# Patient Record
Sex: Female | Born: 1935 | Race: White | Hispanic: No | Smoking: Never smoker
Health system: Southern US, Community
[De-identification: ages and names within clinical notes are randomized; demographics above are authoritative.]

## PROBLEM LIST (undated history)

## (undated) DIAGNOSIS — Z950 Presence of cardiac pacemaker: Secondary | ICD-10-CM

## (undated) DIAGNOSIS — Z8673 Personal history of transient ischemic attack (TIA), and cerebral infarction without residual deficits: Secondary | ICD-10-CM

## (undated) DIAGNOSIS — I119 Hypertensive heart disease without heart failure: Secondary | ICD-10-CM

## (undated) DIAGNOSIS — I4891 Unspecified atrial fibrillation: Principal | ICD-10-CM

## (undated) DIAGNOSIS — E785 Hyperlipidemia, unspecified: Secondary | ICD-10-CM

## (undated) DIAGNOSIS — I10 Essential (primary) hypertension: Secondary | ICD-10-CM

## (undated) DIAGNOSIS — I2510 Atherosclerotic heart disease of native coronary artery without angina pectoris: Secondary | ICD-10-CM

## (undated) DIAGNOSIS — H409 Unspecified glaucoma: Secondary | ICD-10-CM

## (undated) HISTORY — PX: ABDOMINAL HYSTERECTOMY: SHX81

## (undated) HISTORY — PX: CARPAL TUNNEL RELEASE: SHX101

## (undated) HISTORY — PX: CHOLECYSTECTOMY: SHX55

## (undated) HISTORY — PX: PACEMAKER INSERTION: SHX728

## (undated) HISTORY — PX: CAROTID STENT: SHX1301

## (undated) HISTORY — PX: EYE SURGERY: SHX253

---

## 2013-04-20 ENCOUNTER — Emergency Department (HOSPITAL_BASED_OUTPATIENT_CLINIC_OR_DEPARTMENT_OTHER): Payer: Medicare Other

## 2013-04-20 ENCOUNTER — Inpatient Hospital Stay (HOSPITAL_BASED_OUTPATIENT_CLINIC_OR_DEPARTMENT_OTHER)
Admission: EM | Admit: 2013-04-20 | Discharge: 2013-04-25 | DRG: 308 | Disposition: A | Discharge status: Home / Self Care | Payer: Medicare Other | Attending: Cardiology | Admitting: Cardiology

## 2013-04-20 ENCOUNTER — Encounter (HOSPITAL_BASED_OUTPATIENT_CLINIC_OR_DEPARTMENT_OTHER): Payer: Self-pay | Admitting: *Deleted

## 2013-04-20 DIAGNOSIS — R079 Chest pain, unspecified: Secondary | ICD-10-CM

## 2013-04-20 DIAGNOSIS — Z7901 Long term (current) use of anticoagulants: Secondary | ICD-10-CM

## 2013-04-20 DIAGNOSIS — I4891 Unspecified atrial fibrillation: Principal | ICD-10-CM

## 2013-04-20 DIAGNOSIS — I10 Essential (primary) hypertension: Secondary | ICD-10-CM | POA: Diagnosis present

## 2013-04-20 DIAGNOSIS — I110 Hypertensive heart disease with heart failure: Secondary | ICD-10-CM | POA: Diagnosis present

## 2013-04-20 DIAGNOSIS — K279 Peptic ulcer, site unspecified, unspecified as acute or chronic, without hemorrhage or perforation: Secondary | ICD-10-CM | POA: Diagnosis present

## 2013-04-20 DIAGNOSIS — Z8673 Personal history of transient ischemic attack (TIA), and cerebral infarction without residual deficits: Secondary | ICD-10-CM

## 2013-04-20 DIAGNOSIS — R269 Unspecified abnormalities of gait and mobility: Secondary | ICD-10-CM | POA: Diagnosis present

## 2013-04-20 DIAGNOSIS — H409 Unspecified glaucoma: Secondary | ICD-10-CM | POA: Diagnosis present

## 2013-04-20 DIAGNOSIS — E669 Obesity, unspecified: Secondary | ICD-10-CM | POA: Diagnosis present

## 2013-04-20 DIAGNOSIS — I5031 Acute diastolic (congestive) heart failure: Secondary | ICD-10-CM | POA: Diagnosis present

## 2013-04-20 DIAGNOSIS — Z6833 Body mass index (BMI) 33.0-33.9, adult: Secondary | ICD-10-CM

## 2013-04-20 DIAGNOSIS — Z7902 Long term (current) use of antithrombotics/antiplatelets: Secondary | ICD-10-CM

## 2013-04-20 DIAGNOSIS — Z950 Presence of cardiac pacemaker: Secondary | ICD-10-CM

## 2013-04-20 DIAGNOSIS — I2510 Atherosclerotic heart disease of native coronary artery without angina pectoris: Secondary | ICD-10-CM

## 2013-04-20 DIAGNOSIS — G909 Disorder of the autonomic nervous system, unspecified: Secondary | ICD-10-CM

## 2013-04-20 DIAGNOSIS — I6990 Unspecified sequelae of unspecified cerebrovascular disease: Secondary | ICD-10-CM

## 2013-04-20 DIAGNOSIS — I495 Sick sinus syndrome: Secondary | ICD-10-CM | POA: Diagnosis present

## 2013-04-20 DIAGNOSIS — Z9861 Coronary angioplasty status: Secondary | ICD-10-CM

## 2013-04-20 DIAGNOSIS — E039 Hypothyroidism, unspecified: Secondary | ICD-10-CM | POA: Diagnosis present

## 2013-04-20 DIAGNOSIS — I119 Hypertensive heart disease without heart failure: Secondary | ICD-10-CM | POA: Diagnosis present

## 2013-04-20 DIAGNOSIS — K219 Gastro-esophageal reflux disease without esophagitis: Secondary | ICD-10-CM | POA: Diagnosis present

## 2013-04-20 DIAGNOSIS — Z91041 Radiographic dye allergy status: Secondary | ICD-10-CM

## 2013-04-20 DIAGNOSIS — E785 Hyperlipidemia, unspecified: Secondary | ICD-10-CM | POA: Diagnosis present

## 2013-04-20 DIAGNOSIS — R0789 Other chest pain: Secondary | ICD-10-CM | POA: Diagnosis present

## 2013-04-20 DIAGNOSIS — I509 Heart failure, unspecified: Secondary | ICD-10-CM | POA: Diagnosis present

## 2013-04-20 HISTORY — DX: Unspecified atrial fibrillation: I48.91

## 2013-04-20 HISTORY — DX: Personal history of transient ischemic attack (TIA), and cerebral infarction without residual deficits: Z86.73

## 2013-04-20 HISTORY — DX: Presence of cardiac pacemaker: Z95.0

## 2013-04-20 HISTORY — DX: Hyperlipidemia, unspecified: E78.5

## 2013-04-20 HISTORY — DX: Unspecified glaucoma: H40.9

## 2013-04-20 HISTORY — DX: Essential (primary) hypertension: I10

## 2013-04-20 HISTORY — DX: Atherosclerotic heart disease of native coronary artery without angina pectoris: I25.10

## 2013-04-20 HISTORY — DX: Hypertensive heart disease without heart failure: I11.9

## 2013-04-20 LAB — CBC WITH DIFFERENTIAL/PLATELET
Basophils Absolute: 0 10*3/uL (ref 0.0–0.1)
Basophils Relative: 0 % (ref 0–1)
Basophils Relative: 0 % (ref 0–1)
Eosinophils Absolute: 0.1 10*3/uL (ref 0.0–0.7)
Eosinophils Absolute: 0.1 10*3/uL (ref 0.0–0.7)
Eosinophils Relative: 1 % (ref 0–5)
Eosinophils Relative: 1 % (ref 0–5)
HCT: 43.6 % (ref 36.0–46.0)
HCT: 41.4 % (ref 36.0–46.0)
Hemoglobin: 14.5 g/dL (ref 12.0–15.0)
Hemoglobin: 14.1 g/dL (ref 12.0–15.0)
Lymphocytes Relative: 28 % (ref 12–46)
Lymphs Abs: 2.3 10*3/uL (ref 0.7–4.0)
MCH: 30.8 pg (ref 26.0–34.0)
MCH: 30.9 pg (ref 26.0–34.0)
MCHC: 33.3 g/dL (ref 30.0–36.0)
MCHC: 34.1 g/dL (ref 30.0–36.0)
MCV: 90.6 fL (ref 78.0–100.0)
Monocytes Absolute: 0.6 10*3/uL (ref 0.1–1.0)
Monocytes Absolute: 0.7 10*3/uL (ref 0.1–1.0)
Monocytes Relative: 9 % (ref 3–12)
Monocytes Relative: 8 % (ref 3–12)
Neutro Abs: 5.2 10*3/uL (ref 1.7–7.7)
Neutrophils Relative %: 63 % (ref 43–77)
Platelets: 201 10*3/uL (ref 150–400)
RBC: 4.57 MIL/uL (ref 3.87–5.11)
RDW: 13.5 % (ref 11.5–15.5)
RDW: 13.4 % (ref 11.5–15.5)
WBC: 8.2 10*3/uL (ref 4.0–10.5)

## 2013-04-20 LAB — TROPONIN I
Troponin I: <0.3 ng/mL (ref <0.30)
Troponin I: <0.3 ng/mL

## 2013-04-20 LAB — COMPREHENSIVE METABOLIC PANEL
Albumin: 3.5 g/dL (ref 3.5–5.2)
BUN: 11 mg/dL (ref 6–23)
Calcium: 9.4 mg/dL (ref 8.4–10.5)
Creatinine, Ser: 0.8 mg/dL (ref 0.50–1.10)
Total Bilirubin: 0.4 mg/dL (ref 0.3–1.2)
Total Protein: 7.2 g/dL (ref 6.0–8.3)

## 2013-04-20 LAB — PROTIME-INR
INR: 0.93 (ref 0.00–1.49)
Prothrombin Time: 12.4 s (ref 11.6–15.2)

## 2013-04-20 LAB — APTT: aPTT: 32 s (ref 24–37)

## 2013-04-20 LAB — MRSA PCR SCREENING: MRSA by PCR: NEGATIVE

## 2013-04-20 MED ORDER — SODIUM CHLORIDE 0.9 % IV SOLN
250.0000 mL | INTRAVENOUS | Status: DC | PRN
  Administered 2013-04-22: 250 mL via INTRAVENOUS

## 2013-04-20 MED ORDER — SODIUM CHLORIDE 0.9 % IJ SOLN: 3.0000 mL | INTRAMUSCULAR | Status: DC | PRN

## 2013-04-20 MED ORDER — METOPROLOL TARTRATE 1 MG/ML IV SOLN
2.5000 mg | Freq: Once | INTRAVENOUS | Status: AC
  Administered 2013-04-20: 2.5 mg via INTRAVENOUS
  Filled 2013-04-20: qty 5

## 2013-04-20 MED ORDER — ONDANSETRON HCL 4 MG/2ML IJ SOLN: 4.0000 mg | Freq: Four times a day (QID) | INTRAMUSCULAR | Status: DC | PRN

## 2013-04-20 MED ORDER — LATANOPROST 0.005 % OP SOLN
1.0000 [drp] | Freq: Every day | OPHTHALMIC | Status: DC
  Administered 2013-04-20 – 2013-04-24 (×5): 1 [drp] via OPHTHALMIC
  Filled 2013-04-20 (×2): qty 2.5

## 2013-04-20 MED ORDER — METOPROLOL TARTRATE 25 MG PO TABS
25.0000 mg | ORAL_TABLET | Freq: Three times a day (TID) | ORAL | Status: DC
  Administered 2013-04-20: 25 mg via ORAL
  Filled 2013-04-20 (×5): qty 1

## 2013-04-20 MED ORDER — CLOPIDOGREL BISULFATE 75 MG PO TABS
75.0000 mg | ORAL_TABLET | Freq: Every day | ORAL | Status: DC
  Administered 2013-04-21 – 2013-04-23 (×3): 75 mg via ORAL
  Filled 2013-04-20 (×3): qty 1

## 2013-04-20 MED ORDER — SIMVASTATIN 40 MG PO TABS
40.0000 mg | ORAL_TABLET | Freq: Every day | ORAL | Status: DC
  Administered 2013-04-21 – 2013-04-22 (×2): 40 mg via ORAL
  Filled 2013-04-20 (×5): qty 1

## 2013-04-20 MED ORDER — LEVOTHYROXINE SODIUM 100 MCG PO TABS
100.0000 ug | ORAL_TABLET | Freq: Every day | ORAL | Status: DC
  Administered 2013-04-21 – 2013-04-25 (×5): 100 ug via ORAL
  Filled 2013-04-20 (×8): qty 1

## 2013-04-20 MED ORDER — ASPIRIN EC 81 MG PO TBEC
81.0000 mg | DELAYED_RELEASE_TABLET | Freq: Every day | ORAL | Status: DC
  Administered 2013-04-20: 81 mg via ORAL
  Filled 2013-04-20 (×2): qty 1

## 2013-04-20 MED ORDER — SODIUM CHLORIDE 0.9 % IJ SOLN
3.0000 mL | Freq: Two times a day (BID) | INTRAMUSCULAR | Status: DC
  Administered 2013-04-20 – 2013-04-22 (×3): 3 mL via INTRAVENOUS
  Administered 2013-04-22: 11:00:00 via INTRAVENOUS
  Administered 2013-04-23 – 2013-04-24 (×2): 3 mL via INTRAVENOUS

## 2013-04-20 MED ORDER — ACETAMINOPHEN 325 MG PO TABS
650.0000 mg | ORAL_TABLET | ORAL | Status: DC | PRN
  Administered 2013-04-20 – 2013-04-23 (×2): 650 mg via ORAL
  Filled 2013-04-20 (×2): qty 2

## 2013-04-20 MED ORDER — POTASSIUM CHLORIDE CRYS ER 20 MEQ PO TBCR
20.0000 meq | EXTENDED_RELEASE_TABLET | Freq: Once | ORAL | Status: AC
  Administered 2013-04-20: 20 meq via ORAL
  Filled 2013-04-20: qty 1

## 2013-04-20 MED ORDER — HYDRALAZINE HCL 25 MG PO TABS
25.0000 mg | ORAL_TABLET | Freq: Three times a day (TID) | ORAL | Status: DC
  Administered 2013-04-20 – 2013-04-25 (×14): 25 mg via ORAL
  Filled 2013-04-20 (×20): qty 1

## 2013-04-20 NOTE — ED Notes (Signed)
Report given to Marylene Land, RN [facility] [id number].

## 2013-04-20 NOTE — ED Notes (Signed)
MD at bedside. 

## 2013-04-20 NOTE — ED Notes (Signed)
[facility] at bedside preparing patient for transport.  

## 2013-04-20 NOTE — H&P (Addendum)
[name] is an 79 y.o. female.   Chief Complaint: palpitations/atrial fibrillation HPI: 79 yo woman with PMH of CAD, stent in 2009 on plavix, CVA 8 years ago, hypertension - volatile blood pressure with presumed autonomic dysfunction, stomach ulcers, symptomatic bradycardia with PPM 4 years ago here after recent move from her home in [place] to [place] with her son with atrial fibrillation + RVR. She tells me she was first diagnosed with atrial fibrillation in December 2013 with trial of warfarin but given history of aneursym it was continued continued and she is very averse to blood thinners. She also has been intolerant of aspirin given worsening ulcers/reflux and she has been on plavix without issues since before her stenting for prior CVA. She has volatile blood pressure and takes hydralazine 25 mg 1-3x daily based on need with bp ranging from 110s-200s/120s. She was last in atrial fibrillation a week ago and she converted with one dose of metoprolol 25 mg, which she takes 3-4x daily approximately every 6-7 hours. She did have some brief chest pain, substernal to left sided slight pressure when her HR was higher that is not currently present. She feels well and has only received IV metoprolol 2.5 mg x1 with HR now 90s-100s. She feels like her atrial fibrillation is kicking in because she's stressed because she was just forced to sell her home in [place] and move to [place] on Thursday to live with her son. No fever/chills/sick contacts. Sleeps on 2 pillows. No PND. Left-sided residual weakness requires her to walk with walker/cane but she gets around and this is stable.   Past Medical History  Diagnosis Date  . Coronary artery disease   . A-fib   . Stroke   . Hypertension   . Glaucoma     Past Surgical History  Procedure Laterality Date  . Pacemaker insertion    . Cholecystectomy    . Abdominal hysterectomy    . Carpal tunnel release    . Eye surgery    . Carotid stent       History reviewed. No pertinent family history. Social History:  reports that she has never smoked. She does not have any smokeless tobacco history on file. She reports that she does not drink alcohol or use illicit drugs.  Allergies:  Allergies  Allergen Reactions  . Cardizem (Diltiazem Hcl) Rash  . Ivp Dye (Iodinated Diagnostic Agents) Rash    Medications Prior to Admission  Medication Sig Dispense Refill  . clopidogrel (PLAVIX) 75 MG tablet Take 75 mg by mouth daily.      . hydrALAZINE (APRESOLINE) 25 MG tablet Take 25 mg by mouth daily as needed (for high blood pressure).       Marland Kitchen latanoprost (XALATAN) 0.005 % ophthalmic solution Place 1 drop into both eyes at bedtime.      Marland Kitchen levothyroxine (SYNTHROID, LEVOTHROID) 100 MCG tablet Take 100 mcg by mouth daily.      . metoprolol tartrate (LOPRESSOR) 25 MG tablet Take 25 mg by mouth 3 (three) times daily.      . pravastatin (PRAVACHOL) 40 MG tablet Take 80 mg by mouth daily.        Results for orders placed during the hospital encounter of 04/20/13 (from the past 48 hour(s))  CBC WITH DIFFERENTIAL     Status: None   Collection Time    04/20/13  2:46 PM      Result Value Range   WBC 7.5  4.0 - 10.5 K/uL  RBC 4.71  3.87 - 5.11 MIL/uL   Hemoglobin 14.5  12.0 - 15.0 g/dL   HCT 16.1  09.6 - 04.5 %   MCV 92.6  78.0 - 100.0 fL   MCH 30.8  26.0 - 34.0 pg   MCHC 33.3  30.0 - 36.0 g/dL   RDW 40.9  81.1 - 91.4 %   Platelets 196  150 - 400 K/uL   Neutrophils Relative % 63  43 - 77 %   Neutro Abs 4.7  1.7 - 7.7 K/uL   Lymphocytes Relative 28  12 - 46 %   Lymphs Abs 2.1  0.7 - 4.0 K/uL   Monocytes Relative 9  3 - 12 %   Monocytes Absolute 0.6  0.1 - 1.0 K/uL   Eosinophils Relative 1  0 - 5 %   Eosinophils Absolute 0.1  0.0 - 0.7 K/uL   Basophils Relative 0  0 - 1 %   Basophils Absolute 0.0  0.0 - 0.1 K/uL  COMPREHENSIVE METABOLIC PANEL     Status: Abnormal   Collection Time    04/20/13  2:46 PM      Result Value Range   Sodium  140  135 - 145 mEq/L   Potassium 3.8  3.5 - 5.1 mEq/L   Chloride 102  96 - 112 mEq/L   CO2 28  19 - 32 mEq/L   Glucose, Bld 137 (*) 70 - 99 mg/dL   BUN 11  6 - 23 mg/dL   Creatinine, Ser 7.82  0.50 - 1.10 mg/dL   Calcium 9.4  8.4 - 95.6 mg/dL   Total Protein 7.2  6.0 - 8.3 g/dL   Albumin 3.5  3.5 - 5.2 g/dL   AST 16  0 - 37 U/L   ALT 13  0 - 35 U/L   Alkaline Phosphatase 88  39 - 117 U/L   Total Bilirubin 0.4  0.3 - 1.2 mg/dL   GFR calc non Af Amer 69 (*) >90 mL/min   GFR calc Af Amer 80 (*) >90 mL/min   Comment:            The eGFR has been calculated     using the CKD EPI equation.     This calculation has not been     validated in all clinical     situations.     eGFR's persistently     <90 mL/min signify     possible Chronic Kidney Disease.  TROPONIN I     Status: None   Collection Time    04/20/13  2:46 PM      Result Value Range   Troponin I <0.30  <0.30 ng/mL   Comment:            Due to the release kinetics of cTnI,     a negative result within the first hours     of the onset of symptoms does not rule out     myocardial infarction with certainty.     If myocardial infarction is still suspected,     repeat the test at appropriate intervals.   Dg Chest Port 1 View  04/20/2013   *RADIOLOGY REPORT*  Clinical Data: Chest pain, atrial fibrillation  PORTABLE CHEST - 1 VIEW  Comparison: None.  Findings: Cardiomegaly is noted.  No acute infiltrate or pulmonary edema.  Dual lead cardiac pacemaker in place.  IMPRESSION: No active disease.  Cardiomegaly.  Dual lead cardiac pacemaker in place.   Original Report Authenticated  By: Natasha Mead, M.D.    Review of Systems  Constitutional: Negative for fever, chills and weight loss.  HENT: Negative for hearing loss, ear pain, nosebleeds and neck pain.   Eyes: Negative for photophobia, pain and discharge.  Respiratory: Negative for hemoptysis, sputum production and shortness of breath.   Cardiovascular: Positive for chest pain and  palpitations. Negative for orthopnea and leg swelling.  Gastrointestinal: Positive for nausea and diarrhea. Negative for heartburn, vomiting, abdominal pain, constipation, blood in stool and melena.  Genitourinary: Negative for frequency and hematuria.  Musculoskeletal: Negative for myalgias.  Skin: Negative for itching and rash.  Neurological: Positive for headaches. Negative for tingling, tremors and sensory change.  Endo/Heme/Allergies: Negative for polydipsia. Bruises/bleeds easily.  Psychiatric/Behavioral: Negative for depression and suicidal ideas.    Blood pressure 161/82, pulse 96, temperature 98 F (36.7 C), temperature source Oral, resp. rate 30, SpO2 97.00%. Physical Exam  Nursing note and vitals reviewed. Constitutional: She is oriented to person, place, and time. She appears well-developed and well-nourished. No distress.  HENT:  Head: Normocephalic and atraumatic.  Nose: Nose normal.  Mouth/Throat: Oropharynx is clear and moist. No oropharyngeal exudate.  Eyes: Conjunctivae and EOM are normal. Pupils are equal, round, and reactive to light. No scleral icterus.  Neck: Normal range of motion. Neck supple. JVD present. No tracheal deviation present. No thyromegaly present.  3 cm above clavicle at 30 degrees  Cardiovascular: Intact distal pulses.  Exam reveals no gallop.   No murmur heard. Irregularly irregular HR 100s  Respiratory: Effort normal and breath sounds normal. No respiratory distress. She has no wheezes. She has no rales.  GI: Soft. Bowel sounds are normal. She exhibits no distension. There is no tenderness. There is no rebound.  Musculoskeletal: Normal range of motion. She exhibits no edema and no tenderness.  Right > left strength; left 4/5, right 5/5 - baseline per patient with prior CVA  Neurological: She is alert and oriented to person, place, and time. No cranial nerve deficit.  Skin: Skin is warm and dry. No rash noted. She is not diaphoretic. No erythema.   Psychiatric: She has a normal mood and affect. Her behavior is normal. Judgment and thought content normal.    Labs reviewed; wbc 7.5, h/h 14.5/43.6, plt 146, na 140, K 3.8, bun/cr 4/0.8, ast/alt 16/13, troponin < 0.3 Chest x-ray: no acute process, dual--chamber PM ECG: atrial fibrillation + RVR rate 130s, subtle inferior twi  Problem List Atrial fibrillation + RVR Coronary artery disease with prior stent in 2009 on Plavix Prior CVA 8 years ago History of aneurysm but another physician has told her she didn't have one Hypertension, autonomic dysfunction Peptic Ulcer Disease Symptomatic Bradycardia s/p PPM 4 years ago, [employer] Chest Pain associated with elevated HR hypothyroidism  Assessment/Plan 79 yo woman with CAD, prior stent, CVA, questionable aneurysm, intolerant of aspirin, volatile blood pressure with symptoms/signs c/w autonomic dysfunction, PUD, symptomatic bradycardia with PPM here with atrial fibrillation with RVR. Apparent trigger for atrial fibrillation is her recent move/selling home. Potential other triggers include UTI (has had before), other infections, development of heart failure, MI, worsening/development of structural heart disease, thyroid disease among other etiologies. She currently is rate controlled on metoprolol. She's averse to anticoagulation given her PUD, issues (questionable) with warfarin and possible aneurysm. Her C2HA2DVASC is 98 for age, female, prior CVA, age > 79, hypertension. We had a long discussion and she understands the risks of stroke and we will need to obtain outside records (Neurologist she saw was  in [place], [place]), potentially discuss with neurology. Apixaban may be the best agent for anticoagulation in her low bleeding risk profile. She is willing to take aspirin. At this time, NPO after MN in case we need to pursue DCCV, aspirin 81 mg now, continue plavix, trend troponins, continue telemetry, 25 mg q6h metoprolol PO, gentle blood pressure  control with hydralazine and evaluate for triggers. I discussed the plan in depth with Ms. Latorre and her son and her risk of stroke in setting of atrial fibrillation despite limited anticoagulation (just aspirin and plavix) which she prefers at this time. We will need to obtain records from [place], [place], [place], [place] among other locations.  - aspirin 81 mg now and daily in hospital - continue plavix 75 mg daily - hydralazine 25 mg PO q8h blood pressure with holding paramaters - continue synthroid - tsh, bnp, lipid panel, urinalysis  - echocardiogram in AM - rate control with metoprolol 25 mg PO q6h - consider apixaban instead of aspirin based on further discussions with patient, outside hospital review and finances Katrianna Friesenhahn 04/20/2013, 6:29 PM

## 2013-04-20 NOTE — ED Notes (Signed)
Pt brought to ED via POV  C/o CP and weakness. Taken to ED5, EKG done. Placed on monitor showing Afib. States she just moved here from [place] and has "gotten all worked up". Dr. Judd Lien in to eval.

## 2013-04-20 NOTE — ED Notes (Signed)
Report given to [place], RN

## 2013-04-20 NOTE — ED Provider Notes (Signed)
History     CSN: [id number]  Arrival date & time 04/20/13  1420   First MD Initiated Contact with Patient 04/20/13 1428      Chief Complaint  Patient presents with  . Chest Pain    (Consider location/radiation/quality/duration/timing/severity/associated sxs/prior treatment) HPI Comments: Patient with history of CAD with stent, Afib, Pacer, CVA.  Presents with complaints of palpitations, believes she is in afib again.  She does not report chest pain at present but reports having some intermittent discomfort for the past few days.  She recently moved here from [place] and has no cardiologist locally.  She recently moved in with her son and reports she felt stressed today prior to the afib starting.  She denies shortness of breath.  No fevers or chills.  She did take an extra dose of her metoprolol prior to coming in but this did not help.  Patient is a 79 y.o. female presenting with palpitations. The history is provided by the patient.  Palpitations Palpitations quality:  Irregular Onset quality:  Sudden Timing:  Constant Progression:  Unchanged Chronicity:  Recurrent Context comment:  Stress Relieved by:  Nothing Worsened by:  Nothing tried Ineffective treatments:  Beta blockers Associated symptoms: no leg pain, no lower extremity edema and no shortness of breath     Past Medical History  Diagnosis Date  . Coronary artery disease   . A-fib   . Stroke   . Hypertension   . Glaucoma     Past Surgical History  Procedure Laterality Date  . Pacemaker insertion    . Cholecystectomy    . Abdominal hysterectomy    . Carpal tunnel release    . Eye surgery    . Carotid stent      History reviewed. No pertinent family history.  History  Substance Use Topics  . Smoking status: Never Smoker   . Smokeless tobacco: Not on file  . Alcohol Use: No    OB History   Grav Para Term Preterm Abortions TAB SAB Ect Mult Living                  Review of Systems  Respiratory:  Negative for shortness of breath.   Cardiovascular: Positive for palpitations.  All other systems reviewed and are negative.    Allergies  Cardizem and Ivp dye  Home Medications  No current outpatient prescriptions on file.  BP 186/127  Pulse 92  Temp(Src) 97.8 F (36.6 C) (Oral)  Resp 20  SpO2 97%  Physical Exam  Nursing note and vitals reviewed. Constitutional: She is oriented to person, place, and time. She appears well-developed and well-nourished. No distress.  HENT:  Head: Normocephalic and atraumatic.  Neck: Normal range of motion. Neck supple.  Cardiovascular:  No murmur heard. Heart rate is irregularly irregular.  There are no murmurs.    Pulmonary/Chest: Effort normal and breath sounds normal. No respiratory distress. She has no wheezes.  Abdominal: Soft. Bowel sounds are normal. She exhibits no distension. There is no tenderness.  Musculoskeletal: Normal range of motion.  Neurological: She is alert and oriented to person, place, and time.  Skin: Skin is warm and dry. She is not diaphoretic.    ED Course  Procedures (including critical care time)  Labs Reviewed - No data to display No results found.   No diagnosis found.   Date: 04/20/2013  Rate: 130  Rhythm: atrial fibrillation  QRS Axis: normal  Intervals: normal  ST/T Wave abnormalities: non-specific T wave abnormalities  Conduction Disutrbances:none  Narrative Interpretation:   Old EKG Reviewed: none available    MDM  The patient presents with atrial fibrillation and chest discomfort.  Workup this afternoon shows a negative troponin and ekg without st changes.  She was given lopressor and the rate has improved, however she remains in afib.  I have spoken with Dr. Donnie Aho and she will be admitted to cardiology.          Geoffery Lyons, MD 04/20/13 1536

## 2013-04-21 DIAGNOSIS — Z8673 Personal history of transient ischemic attack (TIA), and cerebral infarction without residual deficits: Secondary | ICD-10-CM

## 2013-04-21 DIAGNOSIS — Z950 Presence of cardiac pacemaker: Secondary | ICD-10-CM | POA: Diagnosis present

## 2013-04-21 HISTORY — DX: Personal history of transient ischemic attack (TIA), and cerebral infarction without residual deficits: Z86.73

## 2013-04-21 HISTORY — DX: Presence of cardiac pacemaker: Z95.0

## 2013-04-21 LAB — CBC
HCT: 41.1 % (ref 36.0–46.0)
MCV: 90.7 fL (ref 78.0–100.0)
RBC: 4.53 MIL/uL (ref 3.87–5.11)
WBC: 7.5 10*3/uL (ref 4.0–10.5)

## 2013-04-21 LAB — LIPID PANEL
Cholesterol: 180 mg/dL (ref 0–200)
HDL: 66 mg/dL (ref >39)
Total CHOL/HDL Ratio: 2.7 RATIO
VLDL: 30 mg/dL (ref 0–40)

## 2013-04-21 LAB — BASIC METABOLIC PANEL
BUN: 10 mg/dL (ref 6–23)
CO2: 29 mEq/L (ref 19–32)
Chloride: 104 mEq/L (ref 96–112)
Creatinine, Ser: 0.75 mg/dL (ref 0.50–1.10)
GFR calc Af Amer: >90 mL/min (ref >90)
Potassium: 3.6 mEq/L (ref 3.5–5.1)

## 2013-04-21 LAB — HEPARIN LEVEL (UNFRACTIONATED): Heparin Unfractionated: 0.25 IU/mL — ABNORMAL LOW (ref 0.30–0.70)

## 2013-04-21 LAB — HEMOGLOBIN A1C: Mean Plasma Glucose: 111 mg/dL (ref <117)

## 2013-04-21 MED ORDER — PREDNISONE 50 MG PO TABS: 60.0000 mg | ORAL_TABLET | ORAL | Status: DC

## 2013-04-21 MED ORDER — DIPHENHYDRAMINE HCL 50 MG/ML IJ SOLN: 25.0000 mg | INTRAMUSCULAR | Status: DC

## 2013-04-21 MED ORDER — FAMOTIDINE IN NACL 20-0.9 MG/50ML-% IV SOLN: 20.0000 mg | INTRAVENOUS | Status: DC

## 2013-04-21 MED ORDER — FAMOTIDINE IN NACL 20-0.9 MG/50ML-% IV SOLN
20.0000 mg | Freq: Once | INTRAVENOUS | Status: AC
  Administered 2013-04-22: 20 mg via INTRAVENOUS
  Filled 2013-04-21: qty 50

## 2013-04-21 MED ORDER — HEPARIN (PORCINE) IN NACL 100-0.45 UNIT/ML-% IJ SOLN
1050.0000 [IU]/h | INTRAMUSCULAR | Status: DC
  Administered 2013-04-22: 1100 [IU]/h via INTRAVENOUS
  Filled 2013-04-21 (×2): qty 250

## 2013-04-21 MED ORDER — DIPHENHYDRAMINE HCL 50 MG/ML IJ SOLN
25.0000 mg | Freq: Once | INTRAMUSCULAR | Status: AC
  Administered 2013-04-22: 25 mg via INTRAVENOUS
  Filled 2013-04-21: qty 1

## 2013-04-21 MED ORDER — HEPARIN BOLUS VIA INFUSION
4000.0000 [IU] | Freq: Once | INTRAVENOUS | Status: AC
  Administered 2013-04-21: 4000 [IU] via INTRAVENOUS
  Filled 2013-04-21: qty 4000

## 2013-04-21 MED ORDER — PREDNISONE 50 MG PO TABS
60.0000 mg | ORAL_TABLET | ORAL | Status: DC
  Filled 2013-04-21: qty 1

## 2013-04-21 MED ORDER — PREDNISONE 50 MG PO TABS
60.0000 mg | ORAL_TABLET | Freq: Once | ORAL | Status: AC
  Administered 2013-04-21: 60 mg via ORAL
  Filled 2013-04-21: qty 1

## 2013-04-21 MED ORDER — METOPROLOL TARTRATE 25 MG PO TABS
25.0000 mg | ORAL_TABLET | Freq: Four times a day (QID) | ORAL | Status: DC
  Administered 2013-04-21 – 2013-04-22 (×5): 25 mg via ORAL
  Filled 2013-04-21 (×5): qty 1

## 2013-04-21 MED ORDER — FAMOTIDINE 20 MG PO TABS: 20.0000 mg | ORAL_TABLET | ORAL | Status: DC

## 2013-04-21 MED ORDER — PREDNISONE 50 MG PO TABS
60.0000 mg | ORAL_TABLET | Freq: Once | ORAL | Status: AC
  Administered 2013-04-22: 60 mg via ORAL
  Filled 2013-04-21: qty 1

## 2013-04-21 MED ORDER — HEPARIN (PORCINE) IN NACL 100-0.45 UNIT/ML-% IJ SOLN
950.0000 [IU]/h | INTRAMUSCULAR | Status: DC
  Administered 2013-04-21: 950 [IU]/h via INTRAVENOUS
  Filled 2013-04-21: qty 250

## 2013-04-21 NOTE — Progress Notes (Signed)
ANTICOAGULATION CONSULT NOTE - Initial Consult  Pharmacy Consult for Heparin Indication: atrial fibrillation  Allergies  Allergen Reactions  . Cardizem (Diltiazem Hcl) Rash  . Ivp Dye (Iodinated Diagnostic Agents) Rash   Patient Measurements: Weight: 202 lb 2.6 oz (91.7 kg) Height 5 ft 5 in per patient IBW 57 kg Heparin Dosing Weight: 77 kg  Vital Signs: Temp: 98.1 F (36.7 C) (06/16 0757) Temp src: Oral (06/16 0757) BP: 171/89 mmHg (06/16 0900) Pulse Rate: 67 (06/16 0757)  Labs:  Recent Labs  04/20/13 1446 04/20/13 1959 04/20/13 2000 04/21/13 0135  HGB 14.5  --  14.1 13.6  HCT 43.6  --  41.4 41.1  PLT 196  --  201 193  APTT  --   --  32  --   LABPROT  --   --  12.4  --   INR  --   --  0.93  --   CREATININE 0.80  --   --  0.75  TROPONINI <0.30 <0.30  --  <0.30   CrCl is unknown because there is no height on file for the current visit.  Assessment: 79 YO pleasant female admitted with shortness of breath, chest pain, and in atrial fibrillation to start IV heparin. Baseline INR was 0.93. Patient reports history of bleeding with NSAID use in the past and also has history of stroke. CHADSVASC = 6. SCr is stable.   Goal of Therapy:  Heparin level 0.3-0.7 units/ml Monitor platelets by anticoagulation protocol: Yes   Plan:  1. Heparin bolus 4000 units x1, then heparin drip at 950 units/hr.  2. Heparin level in 8 hours.  3. Daily heparin level and CBC while on therapy.  4. Monitor for signs and symptoms of bleeding.   Link Snuffer, PharmD, BCPS Clinical Pharmacist [phone] 04/21/2013,10:18 AM

## 2013-04-21 NOTE — Progress Notes (Signed)
Subjective:  She notes her heart is still out of rhythm but distant any shortness of breath or chest pain. Complete history reviewed and additional history taken and confirmed  Objective:  Vital Signs in the last 24 hours: BP 171/89  Pulse 67  Temp(Src) 98.1 F (36.7 C) (Oral)  Resp 15  Wt 91.7 kg (202 lb 2.6 oz)  SpO2 97%  Physical Exam: Pleasant elderly female mildly obese in no acute distress Lungs:  Clear Cardiac:  Irregular rhythm, normal S1 and S2, no S3 Abdomen:  Soft, nontender, no masses Extremities:  No edema present  Intake/Output from previous day: 06/15 0701 - 06/16 0700 In: 300 [P.O.:300] Out: 800 [Urine:800] Weight Filed Weights   04/21/13 0415  Weight: 91.7 kg (202 lb 2.6 oz)    Lab Results: Basic Metabolic Panel:  Recent Labs  16/10/96 1446 04/21/13 0135  NA 140 141  K 3.8 3.6  CL 102 104  CO2 28 29  GLUCOSE 137* 87  BUN 11 10  CREATININE 0.80 0.75    CBC:  Recent Labs  04/20/13 1446 04/20/13 2000 04/21/13 0135  WBC 7.5 8.2 7.5  NEUTROABS 4.7 5.2  --   HGB 14.5 14.1 13.6  HCT 43.6 41.4 41.1  MCV 92.6 90.6 90.7  PLT 196 201 193    BNP    Component Value Date/Time   PROBNP 4164.0* 04/20/2013 1959    PROTIME: Lab Results  Component Value Date   INR 0.93 04/20/2013    Telemetry: Atrial fibrillation with controlled response  Assessment/Plan: 1. Atrial fibrillation of undetermined age of onset 2. Coronary artery disease with previous stent 3. Hypertension 4. Prior stroke  Recommendations:  At the present time she is not anticoagulated. We will not be able to maintain efforts to get her in sinus rhythm left she is willing to take anticoagulation. The history that she gives me is that she had a history of bleeding many years ago when she took nonsteroidal anti-inflammatory agents but has not had any since then. In addition she was told at the time she had a stroke that she had an aneurysm but on a later scan was told that she  did not so this is unclear also. Her CHADS2VASC score is 6 so she is at high risk of stroke without anticoagulation.  The plan will be to initiate short-acting anticoagulation with intravenous heparin, obtain echocardiogram, obtain CTA of brain  to look for evidence of a cerebral aneurysm, obtain old records. I will also interrogate her pacemaker to determine how much she has been in atrial fibrillation. She lives in [place] so the other question will be where she gets her long-term followup.   Darden Palmer  MD [facility]  04/21/2013, 9:36 AM

## 2013-04-21 NOTE — Progress Notes (Signed)
ANTICOAGULATION CONSULT NOTE - Follow Up Consult  Pharmacy Consult for heparin  Indication: atrial fibrillation  Allergies  Allergen Reactions  . Cardizem (Diltiazem Hcl) Rash  . Ivp Dye (Iodinated Diagnostic Agents) Rash    Patient Measurements: Weight: 202 lb 2.6 oz (91.7 kg) Heparin Dosing Weight: 77kg  Vital Signs: Temp: 97.6 F (36.4 C) (06/16 1940) Temp src: Oral (06/16 1940) BP: 140/72 mmHg (06/16 1940) Pulse Rate: 130 (06/16 1754)  Labs:  Recent Labs  04/20/13 1446 04/20/13 1959 04/20/13 2000 04/21/13 0135 04/21/13 2054  HGB 14.5  --  14.1 13.6  --   HCT 43.6  --  41.4 41.1  --   PLT 196  --  201 193  --   APTT  --   --  32  --   --   LABPROT  --   --  12.4  --   --   INR  --   --  0.93  --   --   HEPARINUNFRC  --   --   --   --  0.25*  CREATININE 0.80  --   --  0.75  --   TROPONINI <0.30 <0.30  --  <0.30  --     CrCl is unknown because there is no height on file for the current visit.   Medications:  Scheduled:  . clopidogrel  75 mg Oral Daily  . [START ON 04/22/2013] diphenhydrAMINE  25 mg Intravenous Once  . [START ON 04/22/2013] famotidine (PEPCID) IV  20 mg Intravenous Once  . hydrALAZINE  25 mg Oral Q8H  . latanoprost  1 drop Both Eyes QHS  . levothyroxine  100 mcg Oral QAC breakfast  . metoprolol tartrate  25 mg Oral Q6H  . [START ON 04/22/2013] predniSONE  60 mg Oral Once  . simvastatin  40 mg Oral q1800  . sodium chloride  3 mL Intravenous Q12H   Infusions:  . heparin Stopped (04/21/13 1430)    Assessment: 79 YO  female admitted with shortness of breath, chest pain, and in atrial fibrillationon IV heparin. The initial heparin level is 0.25 on 950 units/hr.  Goal of Therapy:  Heparin level 0.3-0.7 units/ml Monitor platelets by anticoagulation protocol: Yes   Plan:  -Increase heparin to 1100 units/hr -Recheck heparin level in 8hrs  Harland German, Pharm D 04/21/2013 10:00 PM

## 2013-04-21 NOTE — Progress Notes (Signed)
UR Completed.  [name] Jane [phone] 04/21/2013  

## 2013-04-21 NOTE — Progress Notes (Signed)
  Echocardiogram 2D Echocardiogram has been performed.  Arvil Chaco 04/21/2013, 3:51 PM

## 2013-04-22 ENCOUNTER — Observation Stay (HOSPITAL_COMMUNITY): Payer: Medicare Other

## 2013-04-22 ENCOUNTER — Encounter (HOSPITAL_COMMUNITY): Payer: Self-pay

## 2013-04-22 LAB — CBC
MCH: 30.2 pg (ref 26.0–34.0)
MCHC: 33.4 g/dL (ref 30.0–36.0)
Platelets: 189 10*3/uL (ref 150–400)
RDW: 13.7 % (ref 11.5–15.5)

## 2013-04-22 LAB — HEPARIN LEVEL (UNFRACTIONATED): Heparin Unfractionated: 0.68 IU/mL (ref 0.30–0.70)

## 2013-04-22 MED ORDER — IOHEXOL 350 MG/ML SOLN
50.0000 mL | Freq: Once | INTRAVENOUS | Status: AC | PRN
  Administered 2013-04-22: 50 mL via INTRAVENOUS

## 2013-04-22 MED ORDER — DIGOXIN 0.25 MG/ML IJ SOLN
0.2500 mg | Freq: Four times a day (QID) | INTRAMUSCULAR | Status: AC
  Administered 2013-04-22 – 2013-04-23 (×3): 0.25 mg via INTRAVENOUS
  Filled 2013-04-22 (×6): qty 1

## 2013-04-22 MED ORDER — APIXABAN 5 MG PO TABS
5.0000 mg | ORAL_TABLET | Freq: Two times a day (BID) | ORAL | Status: DC
  Administered 2013-04-22 – 2013-04-25 (×6): 5 mg via ORAL
  Filled 2013-04-22 (×11): qty 1

## 2013-04-22 MED ORDER — HEPARIN (PORCINE) IN NACL 100-0.45 UNIT/ML-% IJ SOLN
1100.0000 [IU]/h | INTRAMUSCULAR | Status: DC
  Administered 2013-04-22: 1100 [IU]/h via INTRAVENOUS
  Filled 2013-04-22: qty 250

## 2013-04-22 MED ORDER — METOPROLOL TARTRATE 50 MG PO TABS
50.0000 mg | ORAL_TABLET | Freq: Four times a day (QID) | ORAL | Status: DC
  Administered 2013-04-22: 25 mg via ORAL
  Administered 2013-04-22 – 2013-04-23 (×4): 50 mg via ORAL
  Filled 2013-04-22 (×9): qty 1

## 2013-04-22 NOTE — Progress Notes (Signed)
ANTICOAGULATION CONSULT NOTE - Follow Up Consult  Pharmacy Consult for heparin  Indication: atrial fibrillation  Allergies  Allergen Reactions  . Crestor (Rosuvastatin)   . Hydrochlorothiazide   . Lipitor (Atorvastatin)   . Cardizem (Diltiazem Hcl) Rash  . Ivp Dye (Iodinated Diagnostic Agents) Rash   Patient Measurements: Weight: 202 lb 2.6 oz (91.7 kg) Heparin Dosing Weight: 77kg  Vital Signs: Temp: 97.6 F (36.4 C) (06/17 1600) Temp src: Oral (06/17 1600) BP: 175/99 mmHg (06/17 1816) Pulse Rate: 126 (06/17 1816)  Labs:  Recent Labs  04/20/13 1446 04/20/13 1959 04/20/13 2000 04/21/13 0135 04/21/13 2054 04/22/13 0830 04/22/13 1700  HGB 14.5  --  14.1 13.6  --  14.9  --   HCT 43.6  --  41.4 41.1  --  44.6  --   PLT 196  --  201 193  --  189  --   APTT  --   --  32  --   --   --   --   LABPROT  --   --  12.4  --   --   --   --   INR  --   --  0.93  --   --   --   --   HEPARINUNFRC  --   --   --   --  0.25* 0.69 0.68  CREATININE 0.80  --   --  0.75  --   --   --   TROPONINI <0.30 <0.30  --  <0.30  --   --   --    CrCl is unknown because there is no height on file for the current visit.  Medications:  Scheduled:  . clopidogrel  75 mg Oral Daily  . hydrALAZINE  25 mg Oral Q8H  . latanoprost  1 drop Both Eyes QHS  . levothyroxine  100 mcg Oral QAC breakfast  . metoprolol tartrate  50 mg Oral Q6H  . simvastatin  40 mg Oral q1800  . sodium chloride  3 mL Intravenous Q12H   Infusions:  . heparin 1,100 Units/hr (04/22/13 1056)    Assessment: 79 YO  female admitted with shortness of breath, chest pain, and in atrial fibrillation on IV heparin.   This AM the heparin level is 0.68 (therapeutic at upper end) on 1100 units/hr.  CBC is within normal limits.  No bleeding reported.   Goal of Therapy:  Heparin level 0.3-0.7 units/ml Monitor platelets by anticoagulation protocol: Yes   Plan: 1) Continue heparin at 1100 units / hr 2) Follow up AM CBC, heparin  level  Thank you. Okey Regal, PharmD [phone]    04/22/2013 6:40 PM

## 2013-04-22 NOTE — Progress Notes (Addendum)
ANTICOAGULATION CONSULT NOTE - Follow Up Consult  Pharmacy Consult for heparin  Indication: atrial fibrillation  Allergies  Allergen Reactions  . Cardizem (Diltiazem Hcl) Rash  . Ivp Dye (Iodinated Diagnostic Agents) Rash   Patient Measurements: Weight: 202 lb 2.6 oz (91.7 kg) Heparin Dosing Weight: 77kg  Vital Signs: Temp: 97.5 F (36.4 C) (06/17 0735) Temp src: Oral (06/17 0735) BP: 151/78 mmHg (06/17 0735)  Labs:  Recent Labs  04/20/13 1446 04/20/13 1959 04/20/13 2000 04/21/13 0135 04/21/13 2054 04/22/13 0830  HGB 14.5  --  14.1 13.6  --  14.9  HCT 43.6  --  41.4 41.1  --  44.6  PLT 196  --  201 193  --  189  APTT  --   --  32  --   --   --   LABPROT  --   --  12.4  --   --   --   INR  --   --  0.93  --   --   --   HEPARINUNFRC  --   --   --   --  0.25* 0.69  CREATININE 0.80  --   --  0.75  --   --   TROPONINI <0.30 <0.30  --  <0.30  --   --    CrCl is unknown because there is no height on file for the current visit.  Medications:  Scheduled:  . clopidogrel  75 mg Oral Daily  . hydrALAZINE  25 mg Oral Q8H  . latanoprost  1 drop Both Eyes QHS  . levothyroxine  100 mcg Oral QAC breakfast  . metoprolol tartrate  50 mg Oral Q6H  . simvastatin  40 mg Oral q1800  . sodium chloride  3 mL Intravenous Q12H   Infusions:  . heparin 1,100 Units/hr (04/22/13 0557)    Assessment: 79 YO  female admitted with shortness of breath, chest pain, and in atrial fibrillation on IV heparin.   This AM the heparin level is 0.69 (therapeutic at upper end) on 1100 units/hr.  CBC is within normal limits.  No bleeding reported.   Goal of Therapy:  Heparin level 0.3-0.7 units/ml Monitor platelets by anticoagulation protocol: Yes   Plan:  -Continue heparin at 1100 units/hr -Confirm with 8 hour heparin level   -Follow-up daily heparin level and CBC  Link Snuffer, PharmD, BCPS Clinical Pharmacist [phone]   04/22/2013 9:24 AM

## 2013-04-22 NOTE — Progress Notes (Signed)
Records are received from [facility] and were reviewed in detail. Stroke was in 2006. She had a catheterization that was complicated with ventricular fibrillation when the right coronary artery was injected and January of 2009 and she was transferred to [facility]: [name]. She had a 2.5 x 12 mm mini vision stent placed in the mid LAD. There is minimal disease in the other vessels. She was taken back there in April and had a repeat catheterization that showed her stent site to be patent. There is mention of atrial fibrillation previously.  Previously she was not anticoagulated because of a possible 3 mm brain aneurysm noted in the old records. CT angiogram today did not show any evidence of aneurysm formation. She remains in atrial fibrillation with somewhat rapid response and doesn't want to take a higher dose of beta blockers because she states that it causes headache and makes her feel bad. She states the diltiazem caused her arm to itch.  I am going start her on Eliquus 5 mg twice daily and consider changing her to just a low-dose aspirin because of the previous coronary stent. Go ahead and start her on some Lanoxin to help with rate control. Move to floor.  Darden Palmer MD [facility]

## 2013-04-22 NOTE — Progress Notes (Signed)
Pt complained of numbness on the left side after receiving the first dose of Eliquis and digoxin IV 0.25mg . Dr. Charm Barges notified. Pt remained alert and oriented and able to move both extremities. Will continue to monitor pt.Wyndi Northrup Seromines

## 2013-04-22 NOTE — Progress Notes (Signed)
Subjective:  Feeling well today, but still with some rapid afib with activity.  Pacer interrrogation showed her to be in atrial fib since the 12th.  She had a contrast allergy so she was premedicated with steroids, benadryl and pepcid. No chest pain or SOB.  Objective:  Vital Signs in the last 24 hours: BP 151/78  Pulse 130  Temp(Src) 97.5 F (36.4 C) (Oral)  Resp 19  Wt 91.7 kg (202 lb 2.6 oz)  SpO2 98%  Physical Exam: Pleasant elderly female mildly obese in no acute distress Lungs:  Clear Cardiac:  Irregular rhythm, normal S1 and S2, no S3 Abdomen:  Soft, nontender, no masses Extremities:  No edema present  Intake/Output from previous day: 06/16 0701 - 06/17 0700 In: 1335.6 [P.O.:1005; I.V.:280.6; IV Piggyback:50] Out: 2400 [Urine:2400] Weight Filed Weights   04/21/13 0415  Weight: 91.7 kg (202 lb 2.6 oz)    Lab Results: Basic Metabolic Panel:  Recent Labs  16/10/96 1446 04/21/13 0135  NA 140 141  K 3.8 3.6  CL 102 104  CO2 28 29  GLUCOSE 137* 87  BUN 11 10  CREATININE 0.80 0.75    CBC:  Recent Labs  04/20/13 1446 04/20/13 2000 04/21/13 0135  WBC 7.5 8.2 7.5  NEUTROABS 4.7 5.2  --   HGB 14.5 14.1 13.6  HCT 43.6 41.4 41.1  MCV 92.6 90.6 90.7  PLT 196 201 193    BNP    Component Value Date/Time   PROBNP 4164.0* 04/20/2013 1959    PROTIME: Lab Results  Component Value Date   INR 0.93 04/20/2013    Telemetry: Atrial fibrillation with controlled response  Assessment/Plan: 1. Atrial fibrillation onset greater than 24 hours 2. Coronary artery disease with previous stent 3. Hypertension 4. Prior stroke  Recommendations:  She is on heparin.  Plan CTA of head this am to see if aneurysm present. WIll increase dilitazem and beta blockers. Move to floor after CTA.  Darden Palmer  MD [facility]  04/22/2013, 8:34 AM

## 2013-04-23 LAB — CBC
HCT: 42 % (ref 36.0–46.0)
MCH: 30.3 pg (ref 26.0–34.0)
MCHC: 33.1 g/dL (ref 30.0–36.0)
MCV: 91.5 fL (ref 78.0–100.0)
RDW: 13.8 % (ref 11.5–15.5)

## 2013-04-23 MED ORDER — DIGOXIN 250 MCG PO TABS
0.2500 mg | ORAL_TABLET | Freq: Every day | ORAL | Status: DC
  Administered 2013-04-23: 0.25 mg via ORAL
  Filled 2013-04-23 (×2): qty 1

## 2013-04-23 MED ORDER — ASPIRIN EC 81 MG PO TBEC
81.0000 mg | DELAYED_RELEASE_TABLET | Freq: Every day | ORAL | Status: DC
  Administered 2013-04-23 – 2013-04-25 (×3): 81 mg via ORAL
  Filled 2013-04-23 (×3): qty 1

## 2013-04-23 MED ORDER — METOPROLOL TARTRATE 50 MG PO TABS
50.0000 mg | ORAL_TABLET | Freq: Three times a day (TID) | ORAL | Status: DC
  Administered 2013-04-23 – 2013-04-25 (×5): 50 mg via ORAL
  Filled 2013-04-23 (×10): qty 1

## 2013-04-23 NOTE — Care Management Note (Signed)
    Page 1 of 1   04/23/2013     2:26:39 PM   CARE MANAGEMENT NOTE 04/23/2013  Patient:  [name],[name]   Account Number:  [id number]  Date Initiated:  04/23/2013  Documentation initiated by:  Avie Arenas  Subjective/Objective Assessment:   Afib -  Lives with son     Action/Plan:   Anticipated DC Date:  04/25/2013   Anticipated DC Plan:  HOME/SELF CARE      DC Planning Services  CM consult      Choice offered to / List presented to:             Status of service:  In process, will continue to follow Medicare Important Message given?   (If response is "NO", the following Medicare IM given date fields will be blank) Date Medicare IM given:   Date Additional Medicare IM given:    Discharge Disposition:    Per UR Regulation:  Reviewed for med. necessity/level of care/duration of stay  If discussed at Long Length of Stay Meetings, dates discussed:    Comments:  04-23-13 - 2pm Avie Arenas, RNBSN [phone] Talked with pateint.  Has just been ambulating with can and assistance x1.  Short distance and shakey.  States has walker, rollator and w/c at home.  has other equipment from previous stroke - doesn't feel needs any more.  Discussed possible HH PT or RN. Very hesitiant about this assistance. States has gotten along fine before and now son will be with her 24/7 as he is disabled but can assist her.  Agreed that she will think about and talk with her son about [facility] and if she would like set up will let us know prior to discharge.

## 2013-04-23 NOTE — Progress Notes (Signed)
Subjective:  Feeling fine this am.  Started on IV dig last pm and the heart rate is much better controlled this am.  Not SOB.  C/o numbness of left side post Eliquus last pm.  Patient anxious and concerned re side effects. No; chest pain.  Objective:  Vital Signs in the last 24 hours: BP 151/97  Pulse 71  Temp(Src) 97.3 F (36.3 C) (Oral)  Resp 15  Wt 91.7 kg (202 lb 2.6 oz)  SpO2 98%  Physical Exam: Pleasant elderly female mildly obese in no acute distress Lungs:  Clear Cardiac:  Irregular rhythm, normal S1 and S2, no S3 Abdomen:  Soft, nontender, no masses Extremities:  No edema present  Intake/Output from previous day: 06/17 0701 - 06/18 0700 In: 1049 [P.O.:815; I.V.:234] Out: 675 [Urine:675] Weight Filed Weights   04/21/13 0415  Weight: 91.7 kg (202 lb 2.6 oz)    Lab Results: Basic Metabolic Panel:  Recent Labs  96/04/54 1446 04/21/13 0135  NA 140 141  K 3.8 3.6  CL 102 104  CO2 28 29  GLUCOSE 137* 87  BUN 11 10  CREATININE 0.80 0.75    CBC:  Recent Labs  04/20/13 1446 04/20/13 2000  04/22/13 0830 04/23/13 0412  WBC 7.5 8.2  < > 6.6 11.3*  NEUTROABS 4.7 5.2  --   --   --   HGB 14.5 14.1  < > 14.9 13.9  HCT 43.6 41.4  < > 44.6 42.0  MCV 92.6 90.6  < > 90.5 91.5  PLT 196 201  < > 189 198  < > = values in this interval not displayed.  BNP    Component Value Date/Time   PROBNP 4164.0* 04/20/2013 1959    PROTIME: Lab Results  Component Value Date   INR 0.93 04/20/2013    Telemetry: Atrial fibrillation with controlled response now  Assessment/Plan: 1. Atrial fibrillation persistent 2. Coronary artery disease with previous stent 3. Hypertension 4. Prior stroke 5. No evidence of aneurysm on CTA  Recommendations:  I will continue Eliquus and plan to move to floor.  Walk in hall.  Continue digoxin.  Darden Palmer  MD [facility]  04/23/2013, 9:03 AM

## 2013-04-24 LAB — CBC
MCH: 30.1 pg (ref 26.0–34.0)
MCHC: 32.9 g/dL (ref 30.0–36.0)
Platelets: 179 10*3/uL (ref 150–400)

## 2013-04-24 MED ORDER — DIGOXIN 125 MCG PO TABS: 0.1250 mg | ORAL_TABLET | Freq: Every day | ORAL | Status: DC

## 2013-04-24 MED ORDER — DIGOXIN 125 MCG PO TABS
0.1250 mg | ORAL_TABLET | Freq: Every day | ORAL | Status: DC
  Administered 2013-04-24 – 2013-04-25 (×2): 0.125 mg via ORAL
  Filled 2013-04-24 (×3): qty 1

## 2013-04-24 NOTE — Progress Notes (Signed)
Subjective:  The patient was transferred out of the unit. Her atrial fibrillation rate has remained stable overnight. She complained of some atypical chest pain after taking digoxin last night. She remains quite anxious. She has not really ambulated that much and lives with a disabled son at home. She is not currently short of breath and is not having any anginal pain.  Objective:  Vital Signs in the last 24 hours: BP 140/80  Pulse 71  Temp(Src) 97.3 F (36.3 C) (Oral)  Resp 18  Ht 5\' 5"  (1.651 m)  Wt 91.7 kg (202 lb 2.6 oz)  BMI 33.64 kg/m2  SpO2 97%  Physical Exam: Pleasant elderly female mildly obese in no acute distress Lungs:  Clear Cardiac:  Irregular rhythm, normal S1 and S2, no S3 Abdomen:  Soft, nontender, no masses Extremities:  No edema present  Intake/Output from previous day: 06/18 0701 - 06/19 0700 In: 960 [P.O.:960] Out: 1100 [Urine:1100] Weight Filed Weights   04/21/13 0415  Weight: 91.7 kg (202 lb 2.6 oz)    Lab Results:  CBC:  Recent Labs  04/23/13 0412 04/24/13 0500  WBC 11.3* 6.6  HGB 13.9 14.1  HCT 42.0 42.8  MCV 91.5 91.5  PLT 198 179    BNP    Component Value Date/Time   PROBNP 4164.0* 04/20/2013 1959    PROTIME: Lab Results  Component Value Date   INR 0.93 04/20/2013    Telemetry: Atrial fibrillation with controlled response now  Assessment/Plan: 1. Atrial fibrillation persistent 2. Coronary artery disease with previous stent 3. Hypertension 4. Prior stroke 5. No evidence of aneurysm on CTA 6. Acute diastolic heart failure due to atrial fibrillation but appears to be resolved  Recommendations:  She needs to ambulate with physical therapy. Watch in the hospital one more day because of the chest pain last night. Reduce digoxin to 0.125 mg daily. Hopefully home in the morning. She is reluctant to have home health but I think this might be a good idea for her. Once she has been anticoagulated for at least 3 weeks, consider  trial of amiodarone and then cardioversion to get her back into rhythm  W. Viann Fish, Montez Hageman.  MD [facility]  04/24/2013, 10:44 AM

## 2013-04-24 NOTE — Evaluation (Signed)
Physical Therapy Evaluation Patient Details Name: [name] MRN: [id number] DOB: 01/23/1933 Today's Date: 04/24/2013 Time: 7829-5621 PT Time Calculation (min): 22 min  PT Assessment / Plan / Recommendation Clinical Impression  Pt admitted for Afib and mobilizing well although weak with decreased activity tolerance. Pt was living alone until last week and now resides with son and states she will be able to care for herself despite weakness and education acutely. Pt would benefit from HHPT to maximize function and safety as well as acute therapy to address deficits.     PT Assessment  Patient needs continued PT services    Follow Up Recommendations  Home health PT (pt currently refusing HHPT)    Does the patient have the potential to tolerate intense rehabilitation      Barriers to Discharge Decreased caregiver support      Equipment Recommendations  None recommended by PT    Recommendations for Other Services     Frequency Min 3X/week    Precautions / Restrictions Precautions Precautions: Fall   Pertinent Vitals/Pain No pain NSR throughout      Mobility  Bed Mobility Bed Mobility: Not assessed Transfers Transfers: Sit to Stand;Stand to Sit Sit to Stand: 6: Modified independent (Device/Increase time);From toilet Stand to Sit: 6: Modified independent (Device/Increase time);To chair/3-in-1;With armrests Ambulation/Gait Ambulation/Gait Assistance: 5: Supervision Ambulation Distance (Feet): 300 Feet Assistive device: Rolling walker Ambulation/Gait Assistance Details: cueing for posture and position in RW Gait Pattern: Step-through pattern;Decreased dorsiflexion - left;Trunk flexed (toe out on left) Gait velocity: decreased Stairs: No (pt denied attempting)    Exercises General Exercises - Lower Extremity Long Arc Quad: AROM;Both;10 reps;Seated Hip Flexion/Marching: AROM;Both;10 reps;Seated   PT Diagnosis: Difficulty walking  PT Problem List: Decreased  strength;Decreased activity tolerance;Decreased mobility;Decreased knowledge of precautions PT Treatment Interventions: Gait training;DME instruction;Therapeutic activities;Therapeutic exercise;Functional mobility training;Patient/family education   PT Goals Acute Rehab PT Goals PT Goal Formulation: With patient Time For Goal Achievement: 05/01/13 Potential to Achieve Goals: Fair Pt will go Sit to Stand: with modified independence (without armrests) PT Goal: Sit to Stand - Progress: Goal set today Pt will go Stand to Sit: with modified independence PT Goal: Stand to Sit - Progress: Goal set today Pt will Ambulate: >150 feet;with modified independence;with least restrictive assistive device PT Goal: Ambulate - Progress: Goal set today Pt will Go Up / Down Stairs: 1-2 stairs;with least restrictive assistive device;with modified independence PT Goal: Up/Down Stairs - Progress: Goal set today  Visit Information  Last PT Received On: 04/24/13 Assistance Needed: +1    Subjective Data  Subjective: I just think I'll be ok without home therapy Patient Stated Goal: get stronger to go home   Prior Functioning  Home Living Lives With: Son Available Help at Discharge: Family Type of Home: House Home Access: Stairs to enter Secretary/administrator of Steps: 2 Home Layout: One level Bathroom Shower/Tub: Health visitor: Standard Home Adaptive Equipment: Bedside commode/3-in-1;Walker - four wheeled;Walker - rolling;Shower chair with back;Grab bars in shower Prior Function Level of Independence: Independent with assistive device(s) Able to Take Stairs?: Yes Vocation: Retired Musician: No difficulties    Copywriter, advertising Arousal/Alertness: Awake/alert Behavior During Therapy: WFL for tasks assessed/performed Overall Cognitive Status: Within Functional Limits for tasks assessed    Extremity/Trunk Assessment Left Upper Extremity Assessment LUE  ROM/Strength/Tone: Deficits LUE ROM/Strength/Tone Deficits: 2/5 gross strength prior CVA Right Lower Extremity Assessment RLE ROM/Strength/Tone: WFL for tasks assessed Left Lower Extremity Assessment LLE ROM/Strength/Tone: Deficits LLE ROM/Strength/Tone Deficits: grossly 3/5  hip and knee flexion with 2-/5 dorsiflexion   Balance    End of Session PT - End of Session Activity Tolerance: Patient tolerated treatment well Patient left: in chair;with call bell/phone within reach Nurse Communication: Mobility status  GP     Toney Sang Beth 04/24/2013, 1:02 PM Delaney Meigs, PT [phone]

## 2013-04-25 ENCOUNTER — Encounter (HOSPITAL_COMMUNITY): Payer: Self-pay | Admitting: Cardiology

## 2013-04-25 DIAGNOSIS — Z7901 Long term (current) use of anticoagulants: Secondary | ICD-10-CM

## 2013-04-25 LAB — CBC
MCV: 91.4 fL (ref 78.0–100.0)
Platelets: 190 10*3/uL (ref 150–400)
RDW: 13.9 % (ref 11.5–15.5)
WBC: 8.2 10*3/uL (ref 4.0–10.5)

## 2013-04-25 LAB — BASIC METABOLIC PANEL
Chloride: 101 mEq/L (ref 96–112)
Creatinine, Ser: 0.85 mg/dL (ref 0.50–1.10)
GFR calc Af Amer: 74 mL/min — ABNORMAL LOW (ref >90)
Potassium: 4.7 mEq/L (ref 3.5–5.1)

## 2013-04-25 MED ORDER — DIGOXIN 125 MCG PO TABS: 0.1250 mg | ORAL_TABLET | Freq: Every day | ORAL | Status: AC

## 2013-04-25 MED ORDER — METOPROLOL TARTRATE 50 MG PO TABS: 50.0000 mg | ORAL_TABLET | Freq: Two times a day (BID) | ORAL | Status: AC

## 2013-04-25 MED ORDER — APIXABAN 5 MG PO TABS: 5.0000 mg | ORAL_TABLET | Freq: Two times a day (BID) | ORAL | Status: AC

## 2013-04-25 MED ORDER — ASPIRIN 81 MG PO TBEC: 81.0000 mg | DELAYED_RELEASE_TABLET | Freq: Every day | ORAL | Status: AC

## 2013-04-25 MED ORDER — METOPROLOL TARTRATE 50 MG PO TABS: 50.0000 mg | ORAL_TABLET | Freq: Three times a day (TID) | ORAL | Status: DC

## 2013-04-25 NOTE — Progress Notes (Signed)
Discharged to home with family office visits in place teaching done  

## 2013-04-25 NOTE — Discharge Summary (Signed)
Physician Discharge Summary  Patient ID: [name] MRN: [id number] DOB/AGE: Nov 04, 1933 79 y.o.  Admit date: 04/20/2013 Discharge date: 04/25/2013   Primary Discharge Diagnosis:  1. Atrial fibrillation with rapid ventricular response  Secondary Discharge Diagnosis: 2. Acute diastolic heart failure results due to atrial fibrillation 3. Coronary artery disease with previous stenting of the mid LAD 4. Hypertensive heart disease 5. Hyperlipidemia under treatment 6. Prior history of stroke with residual left leg weakness and gait instability 7. Prior pacemaker placement for tachybradycardia syndrome 8. Long-term anticoagulation with Eliquus initiated this admission  Procedures:  2-D echocardiogram, CT angiogram of head  Hospital Course: This 79 year-old female just moved to the region to live with her son. She has a prior history of a stroke in 2006 and has had intermittent atrial fibrillation. She has a previous pacemaker that was implanted in July of 2011 for 6 sinus syndrome, atrial fibrillation and pauses. She also has a prior history of coronary artery disease with a stent in the mid LAD placed in 2009 and was patent on repeat catheterization later that year. She has been under stress and has not previously been anticoagulated because of a remote history of GI bleeding due to nonsteroidal anti-inflammatory agents as well as a possible history of a brain aneurysm. This is somewhat unclear.   She moved to [place] to live with her son from her residence in [place] and had just moved here this week. She had been under a lot of stress with that. She had some brief chest discomfort and presented with palpitations and atrial fibrillation. She was placed on intravenous metoprolol and was admitted to [facility].  She was initially treated with heparin. Interrogation of her pacemaker showed that she had been in atrial fibrillation for 4 days prior to admission. I researched the history  brain Ureacin and elected to do a CTA that was done with premedication for contrast. There was no evidence of aneurysm formation on the CTA and with the history of GI bleeding being when she had nonsteroidal anti-inflammatory agents, we elected to treat her with Eliquus as she was very high risk for stroke with atrial fibrillation. In addition we would not be able to try to restore sinus rhythm until she had been anticoagulated adequately.  We titrated beta blockers but she remained in rapid atrial fibrillation and she was loaded with Lanoxin. With this her heart rate came under good control. Pacemaker was functioning adequately. She tolerated the admission of Eliquus well. She was evaluated by physical therapy but refused both home health nursing as well as home health physical therapy. She is discharged at this time and will be seen in the office in one week. After she has been adequately anticoagulated for 3 weeks we will consider initiation of amiodarone and possibly cardioversion if she fails to revert to sinus rhythm. An echocardiogram showed an ejection fraction of 50-55% and moderate left atrial enlargement. Blood pressure was under reasonable control while she was in the hospital.  Discharge Exam: Blood pressure 137/85, pulse 67, temperature 97.9 F (36.6 C), temperature source Oral, resp. rate 18, height 5\' 5"  (1.651 m), weight 91.7 kg (202 lb 2.6 oz), SpO2 97.00%.   Elderly pleasant female in no acute distress, lungs clear, irregular heart rate  Labs: CBC:   Lab Results  Component Value Date   WBC 8.2 04/25/2013   HGB 13.5 04/25/2013   HCT 41.2 04/25/2013   MCV 91.4 04/25/2013   PLT 190 04/25/2013  CMP:  Recent Labs Lab 04/20/13 1446  04/25/13 0615  NA 140  < > 139  K 3.8  < > 4.7  CL 102  < > 101  CO2 28  < > 30  BUN 11  < > 17  CREATININE 0.80  < > 0.85  CALCIUM 9.4  < > 9.1  PROT 7.2  --   --   BILITOT 0.4  --   --   ALKPHOS 88  --   --   ALT 13  --   --   AST 16  --    --   GLUCOSE 137*  < > 100*  < > = values in this interval not displayed.  Lipid Panel     Component Value Date/Time   CHOL 180 04/21/2013 0135   TRIG 151* 04/21/2013 0135   HDL 66 04/21/2013 0135   CHOLHDL 2.7 04/21/2013 0135   VLDL 30 04/21/2013 0135   LDLCALC 84 04/21/2013 0135    BNP (last 3 results)  Recent Labs  04/20/13 1959  PROBNP 4164.0*    Thyroid: Lab Results  Component Value Date   TSH 3.187 04/20/2013    Hemoglobin A1C: Lab Results  Component Value Date   HGBA1C 5.5 04/20/2013     Radiology:  CXR:  Cardiomegaly, pacemaker in place, no active CHF  CTA head  MPRESSION:  1. 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  2. Diminutive posterior circulation, on the basis of bilateral fetal  PCA origins. See #3.  3. Evidence of chronic small vessel ischemia in the brain stem.  4. Mild-to-moderate focal stenosis of the right A2 segment. Mild  ectasia and irregularity of the dominant right MCA posterior sylvian  branch. ICA siphon atherosclerosis without significant stenosis.  5. Otherwise mild for age nonspecific cerebral white matter signal  changes.   EKG: Initially atrial fibrillation with rapid ventricular response.  Discharge Medications:   Medication List    STOP taking these medications       clopidogrel 75 MG tablet  Commonly known as:  PLAVIX      TAKE these medications       apixaban 5 MG Tabs tablet  Commonly known as:  ELIQUIS  Take 1 tablet (5 mg total) by mouth 2 (two) times daily.     aspirin 81 MG EC tablet  Take 1 tablet (81 mg total) by mouth daily.     digoxin 0.125 MG tablet  Commonly known as:  LANOXIN  Take 1 tablet (0.125 mg total) by mouth daily.     hydrALAZINE 25 MG tablet  Commonly known as:  APRESOLINE  Take 25 mg by mouth daily as needed (for high blood pressure).     latanoprost 0.005 % ophthalmic solution  Commonly known as:  XALATAN  Place 1 drop into both eyes at bedtime.     levothyroxine  100 MCG tablet  Commonly known as:  SYNTHROID, LEVOTHROID  Take 100 mcg by mouth daily.     metoprolol 50 MG tablet  Commonly known as:  LOPRESSOR  Take 1 tablet (50 mg total) by mouth 2 (two) times daily.     pravastatin 40 MG tablet  Commonly known as:  PRAVACHOL  Take 80 mg by mouth daily.       Followup plans and appointments:  The patient refused home health care and home physical therapy. She is to followup with Dr. Donnie Aho in one week.  Time spent with patient to include physician time:  81  minutes  Signed: Darden Palmer. MD [facility] 04/25/2013, 12:15 PM

## 2015-11-10 ENCOUNTER — Telehealth: Payer: Self-pay | Admitting: Cardiology

## 2015-11-10 NOTE — Telephone Encounter (Signed)
Received records from Dr Viann FishSpencer Tilley for appointment on 11/17/15 with Dr Jens Somrenshaw.  [facility] Dr Ludwig Clarksrenshaw's schedule on 11/17/15. lp

## 2015-11-17 ENCOUNTER — Ambulatory Visit: Payer: Medicare Other | Admitting: Cardiology

## 2015-12-08 ENCOUNTER — Ambulatory Visit: Payer: Medicare Other | Admitting: Cardiology

## 2016-06-15 ENCOUNTER — Emergency Department (HOSPITAL_BASED_OUTPATIENT_CLINIC_OR_DEPARTMENT_OTHER)
Admission: EM | Admit: 2016-06-15 | Discharge: 2016-06-16 | Disposition: A | Discharge status: Home / Self Care | Payer: Medicare Other | Attending: Emergency Medicine | Admitting: Emergency Medicine

## 2016-06-15 DIAGNOSIS — R1013 Epigastric pain: Secondary | ICD-10-CM | POA: Insufficient documentation

## 2016-06-15 DIAGNOSIS — I10 Essential (primary) hypertension: Secondary | ICD-10-CM | POA: Diagnosis not present

## 2016-06-15 DIAGNOSIS — R1010 Upper abdominal pain, unspecified: Secondary | ICD-10-CM

## 2016-06-15 DIAGNOSIS — R112 Nausea with vomiting, unspecified: Secondary | ICD-10-CM | POA: Diagnosis not present

## 2016-06-15 DIAGNOSIS — I2510 Atherosclerotic heart disease of native coronary artery without angina pectoris: Secondary | ICD-10-CM | POA: Diagnosis not present

## 2016-06-16 ENCOUNTER — Emergency Department (HOSPITAL_BASED_OUTPATIENT_CLINIC_OR_DEPARTMENT_OTHER): Payer: Medicare Other

## 2016-06-16 ENCOUNTER — Encounter (HOSPITAL_BASED_OUTPATIENT_CLINIC_OR_DEPARTMENT_OTHER): Payer: Self-pay

## 2016-06-16 DIAGNOSIS — R1013 Epigastric pain: Secondary | ICD-10-CM | POA: Diagnosis not present

## 2016-06-16 LAB — CBC WITH DIFFERENTIAL/PLATELET
BASOS ABS: 0 10*3/uL (ref 0.0–0.1)
Basophils Relative: 0 %
EOS ABS: 0 10*3/uL (ref 0.0–0.7)
EOS PCT: 0 %
HCT: 45.3 % (ref 36.0–46.0)
Hemoglobin: 14.7 g/dL (ref 12.0–15.0)
LYMPHS ABS: 1.2 10*3/uL (ref 0.7–4.0)
Lymphocytes Relative: 10 %
MCH: 30.1 pg (ref 26.0–34.0)
MCHC: 32.5 g/dL (ref 30.0–36.0)
MCV: 92.8 fL (ref 78.0–100.0)
Monocytes Absolute: 0.8 10*3/uL (ref 0.1–1.0)
Monocytes Relative: 6 %
Neutro Abs: 10.5 10*3/uL — ABNORMAL HIGH (ref 1.7–7.7)
Neutrophils Relative %: 84 %
PLATELETS: 219 10*3/uL (ref 150–400)
RBC: 4.88 MIL/uL (ref 3.87–5.11)
RDW: 14.2 % (ref 11.5–15.5)
WBC: 12.5 10*3/uL — AB (ref 4.0–10.5)

## 2016-06-16 LAB — URINALYSIS, ROUTINE W REFLEX MICROSCOPIC
BILIRUBIN URINE: NEGATIVE
Glucose, UA: NEGATIVE mg/dL
Ketones, ur: 15 mg/dL — AB
Nitrite: NEGATIVE
PROTEIN: NEGATIVE mg/dL
Specific Gravity, Urine: 1.013 (ref 1.005–1.030)
pH: 5.5 (ref 5.0–8.0)

## 2016-06-16 LAB — COMPREHENSIVE METABOLIC PANEL
ALT: 13 U/L — AB (ref 14–54)
ANION GAP: 10 (ref 5–15)
AST: 18 U/L (ref 15–41)
Albumin: 4.1 g/dL (ref 3.5–5.0)
Alkaline Phosphatase: 82 U/L (ref 38–126)
BUN: 11 mg/dL (ref 6–20)
CHLORIDE: 98 mmol/L — AB (ref 101–111)
CO2: 28 mmol/L (ref 22–32)
CREATININE: 0.79 mg/dL (ref 0.44–1.00)
Calcium: 9.3 mg/dL (ref 8.9–10.3)
GFR calc non Af Amer: >60 mL/min (ref >60)
Glucose, Bld: 141 mg/dL — ABNORMAL HIGH (ref 65–99)
POTASSIUM: 4 mmol/L (ref 3.5–5.1)
SODIUM: 136 mmol/L (ref 135–145)
Total Bilirubin: 1.1 mg/dL (ref 0.3–1.2)
Total Protein: 8 g/dL (ref 6.5–8.1)

## 2016-06-16 LAB — URINE MICROSCOPIC-ADD ON

## 2016-06-16 LAB — TROPONIN I: Troponin I: <0.03 ng/mL (ref <0.03)

## 2016-06-16 LAB — MAGNESIUM: Magnesium: 2.1 mg/dL (ref 1.7–2.4)

## 2016-06-16 LAB — LIPASE, BLOOD: Lipase: 17 U/L (ref 11–51)

## 2016-06-16 LAB — I-STAT CG4 LACTIC ACID, ED: LACTIC ACID, VENOUS: 1.12 mmol/L (ref 0.5–1.9)

## 2016-06-16 MED ORDER — SODIUM CHLORIDE 0.9 % IV BOLUS (SEPSIS)
500.0000 mL | Freq: Once | INTRAVENOUS | Status: AC
  Administered 2016-06-16: 500 mL via INTRAVENOUS

## 2016-06-16 MED ORDER — METOPROLOL TARTRATE 50 MG PO TABS
25.0000 mg | ORAL_TABLET | Freq: Once | ORAL | Status: AC
  Administered 2016-06-16: 25 mg via ORAL
  Filled 2016-06-16: qty 1

## 2016-06-16 MED ORDER — ONDANSETRON HCL 4 MG/2ML IJ SOLN
4.0000 mg | Freq: Once | INTRAMUSCULAR | Status: AC
  Administered 2016-06-16: 4 mg via INTRAVENOUS
  Filled 2016-06-16: qty 2

## 2016-06-16 MED ORDER — ONDANSETRON HCL 4 MG PO TABS: 4.0000 mg | ORAL_TABLET | Freq: Four times a day (QID) | ORAL | 0 refills | Status: AC

## 2016-06-16 NOTE — ED Triage Notes (Signed)
Pt c/o upper abdominal pain with tightness around waist that radiates to her back and left shoulder with three episodes of vomiting that started today at 1700.

## 2016-06-16 NOTE — ED Notes (Signed)
Pt ambulated to wheelchair and from wheelchair to car with use of her cane and was able to get herself dressed with little assistance.  She states she feels much better and verbalizes understanding of dc instructions.

## 2016-06-16 NOTE — ED Notes (Signed)
Pt tolerating PO fluids

## 2016-06-16 NOTE — ED Provider Notes (Signed)
[facility] Provider Note   CSN: [id number] Arrival date & time: 06/15/16  2339  First Provider Contact:  None       History   Chief Complaint Chief Complaint  Patient presents with  . Abdominal Pain    HPI [name] is a 82 y.o. female.  HPI  82 year old female who presents with abdominal pain, nausea and vomiting.  She has a history of CAD, tachybradycardia syndrome with permanent pacemaker, atrial fibrillation on apixaban, prior CVA, hypertension, and hyperlipidemia.  States onset of upper abdominal pain at 5 PM this afternoon.  States it is similar to when she has had gallstone induced pancreatitis in the past but is status post cholecystectomy.  No fevers, chills or night sweats.  Last bowel movement 2 days ago, and states that normally she has a problem with it every other day.  Has noted increased abdominal distention and bloating.  No dysuria, urinary frequency, back pain.  Has had 3 episodes of nonbloody nonbilious emesis.  No chest pain or difficulty breathing or LE edema.  Also history of abdominal hysterectomy.  States she has not been caring for self, eating unwell as her son is in hospital critically ill and she has been very stressed. Had chicken fingers and french fries earlier today before onset of symptoms.  Past Medical History:  Diagnosis Date  . Atrial fibrillation (HCC) 04/20/2013  . Cardiac pacemaker in situ 04/21/2013   05/27/10 SSS atrial fibrillation and pauses [place], [place] Dr. Collier Bullock  [employer] [place] [id number] H  RA  lead 5076   [id number] RV  lead 5076  [id number]   . Coronary artery disease   . Glaucoma   . History of stroke 04/21/2013   2006    . Hyperlipidemia   . Hypertension   . Hypertensive heart disease     Patient Active Problem List   Diagnosis Date Noted  . Long-term (current) use of anticoagulants 04/25/2013  . History of stroke 04/21/2013  . Cardiac pacemaker in situ 04/21/2013  . Atrial fibrillation (HCC)  04/20/2013  . CAD (coronary artery disease) 04/20/2013  . Hypertensive heart disease     Past Surgical History:  Procedure Laterality Date  . ABDOMINAL HYSTERECTOMY    . CAROTID STENT    . CARPAL TUNNEL RELEASE    . CHOLECYSTECTOMY    . EYE SURGERY    . PACEMAKER INSERTION      OB History    No data available       Home Medications    Prior to Admission medications   Medication Sig Start Date End Date Taking? Authorizing Provider  apixaban (ELIQUIS) 5 MG TABS tablet Take 1 tablet (5 mg total) by mouth 2 (two) times daily. 04/25/13   Othella Boyer, MD  aspirin EC 81 MG EC tablet Take 1 tablet (81 mg total) by mouth daily. 04/25/13   Othella Boyer, MD  digoxin (LANOXIN) 0.125 MG tablet Take 1 tablet (0.125 mg total) by mouth daily. 04/25/13   Othella Boyer, MD  hydrALAZINE (APRESOLINE) 25 MG tablet Take 25 mg by mouth daily as needed (for high blood pressure).     Historical Provider, MD  latanoprost (XALATAN) 0.005 % ophthalmic solution Place 1 drop into both eyes at bedtime.    Historical Provider, MD  levothyroxine (SYNTHROID, LEVOTHROID) 100 MCG tablet Take 100 mcg by mouth daily.    Historical Provider, MD  metoprolol (LOPRESSOR) 50 MG tablet Take 1 tablet (50 mg total) by mouth 2 (two)  times daily. 04/25/13   Othella BoyerWilliam S Tilley, MD  ondansetron (ZOFRAN) 4 MG tablet Take 1 tablet (4 mg total) by mouth every 6 (six) hours. 06/16/16   Lavera Guiseana Duo Niclas Markell, MD  pravastatin (PRAVACHOL) 40 MG tablet Take 80 mg by mouth daily.    Historical Provider, MD    Family History No family history on file.  Social History Social History  Substance Use Topics  . Smoking status: Never Smoker  . Smokeless tobacco: Not on file  . Alcohol use No     Allergies   Ciprofloxacin; Crestor [rosuvastatin]; Hydrochlorothiazide; Lipitor [atorvastatin]; Cardizem [diltiazem hcl]; and Ivp dye [iodinated diagnostic agents]   Review of Systems Review of Systems 10/14 systems reviewed and are  negative other than those stated in the HPI   Physical Exam Updated Vital Signs BP 103/75   Pulse 100   Temp 98 F (36.7 C) (Oral)   Resp 20   Ht 5\' 3"  (1.6 m)   Wt 205 lb (93 kg)   SpO2 94%   BMI 36.31 kg/m   Physical Exam Physical Exam  Nursing note and vitals reviewed. Constitutional: Elderly woman, non-toxic, and in no acute distress Head: Normocephalic and atraumatic.  Mouth/Throat: Oropharynx is clear and mucous membranes dry.  Neck: Normal range of motion. Neck supple.  Cardiovascular: Tachycardic rate and irregularly irregular rhythm.   no lower extremity edema Pulmonary/Chest: Effort normal and breath sounds normal.  Abdominal: Soft.  Obese.  Mildly distended.  There is upper abdominal tenderness. There is no rebound and no guarding.  no CVA tenderness  Musculoskeletal: Normal range of motion.  Neurological: Alert, no facial droop, fluent speech, moves all extremities symmetrically Skin: Skin is warm and dry.  Psychiatric: Cooperative   ED Treatments / Results  Labs (all labs ordered are listed, but only abnormal results are displayed) Labs Reviewed  URINALYSIS, ROUTINE W REFLEX MICROSCOPIC (NOT [facility]) - Abnormal; Notable for the following:       Result Value   Hgb urine dipstick MODERATE (*)    Ketones, ur 15 (*)    Leukocytes, UA SMALL (*)    All other components within normal limits  CBC WITH DIFFERENTIAL/PLATELET - Abnormal; Notable for the following:    WBC 12.5 (*)    Neutro Abs 10.5 (*)    All other components within normal limits  COMPREHENSIVE METABOLIC PANEL - Abnormal; Notable for the following:    Chloride 98 (*)    Glucose, Bld 141 (*)    ALT 13 (*)    All other components within normal limits  URINE MICROSCOPIC-ADD ON - Abnormal; Notable for the following:    Squamous Epithelial / LPF 0-5 (*)    Bacteria, UA FEW (*)    Casts HYALINE CASTS (*)    All other components within normal limits  LIPASE, BLOOD  TROPONIN I  MAGNESIUM  TROPONIN  I  I-STAT CG4 LACTIC ACID, ED    EKG  EKG Interpretation None       Radiology Ct Abdomen Pelvis Wo Contrast  Result Date: 06/16/2016 CLINICAL DATA:  Acute onset of upper abdominal pain, nausea and vomiting. Leukocytosis. Initial encounter. EXAM: CT ABDOMEN AND PELVIS WITHOUT CONTRAST TECHNIQUE: Multidetector CT imaging of the abdomen and pelvis was performed following the standard protocol without IV contrast. COMPARISON:  CT of the abdomen and pelvis from 12/01/2013 FINDINGS: The visualized lung bases are clear. Trace pericardial fluid remains within normal limits. Pacemaker leads are partially imaged. The liver and spleen are unremarkable in  appearance. The patient is status post cholecystectomy, with clips noted at the gallbladder fossa. The pancreas and adrenal glands are unremarkable. The kidneys are unremarkable in appearance. There is no evidence of hydronephrosis. No renal or ureteral stones are seen. No perinephric stranding is appreciated. No free fluid is identified. The small bowel is partially filled with fluid and grossly unremarkable. The stomach is within normal limits. No acute vascular abnormalities are seen. The appendix is not definitely characterized; there is no evidence of appendicitis. Mild diverticulosis is noted along the transverse, descending and proximal sigmoid colon, without evidence of diverticulitis. The bladder is mildly distended and grossly unremarkable. The patient is status post hysterectomy. No suspicious adnexal masses are seen. No inguinal lymphadenopathy is seen. No acute osseous abnormalities are identified. Multilevel vacuum phenomenon is noted along the lumbar spine, with underlying facet disease. IMPRESSION: 1. No acute abnormality seen within the abdomen or pelvis. 2. Mild diverticulosis along the transverse, descending and proximal sigmoid colon, without evidence of diverticulitis. 3. Mild degenerative change along the lumbar spine. Electronically Signed    By: Roanna Raider M.D.   On: 06/16/2016 03:26    Procedures Procedures (including critical care time)  Medications Ordered in ED Medications  sodium chloride 0.9 % bolus 500 mL (0 mLs Intravenous Stopped 06/16/16 0159)  ondansetron (ZOFRAN) injection 4 mg (4 mg Intravenous Given 06/16/16 0103)  metoprolol (LOPRESSOR) tablet 25 mg (25 mg Oral Given 06/16/16 0426)     Initial Impression / Assessment and Plan / ED Course  I have reviewed the triage vital signs and the nursing notes.  Pertinent labs & imaging results that were available during my care of the patient were reviewed by me and considered in my medical decision making (see chart for details).  Clinical Course    82 year old female with history of CAD, Afib, PPM who presents with upper abdominal pain, n/v onset at 5 PM. She is in no acute distress. In permanent atrial fibrillation w/ HR 110-120s. She has not taken dose of metoprolol but ? Underlying driving process. Abdomen soft and benign with epigastric tenderness. Normal lactate, minimal leukocytosis. Unremarkable CMP, lipase. UA w/o infection. CT abd/pelvis without acute intraabdominal processes. EKG w/o ischemic changes. SErial troponins normal. And no dynamica EKG changes. Seems unlikely atypical ACS presentation. Normal gait, no HA or other neuro complaint/deficits. Not felt to be c/w posterior circulation CVA or intracranial processes.   Recevied small bolus of fluids w/ antiemetics. Feels symptomatically improved. Repeat exam with near resolved abdominal pain. She tolerates PO intake. Given home dose of metopolol, and HR 80-100s, which is her baseline.   At this time, does not feel she requires admission. Discussed supportive care instructions for home. Reveiwed strict return instructions. Strict return and follow-up instructions reviewed. She expressed understanding of all discharge instructions and felt comfortable with the plan of care.   Final Clinical Impressions(s) /  ED Diagnoses   Final diagnoses:  Non-intractable vomiting with nausea, vomiting of unspecified type  Epigastric pain    New Prescriptions Discharge Medication List as of 06/16/2016  5:38 AM    START taking these medications   Details  ondansetron (ZOFRAN) 4 MG tablet Take 1 tablet (4 mg total) by mouth every 6 (six) hours., Starting Fri 06/16/2016, Print         Lavera Guise, MD 06/16/16 1018

## 2016-06-16 NOTE — Discharge Instructions (Signed)
Return without fail for worsening symptoms, including intractable vomiting, fever, worsening pain, or any other symptoms concerning to you.

## 2017-10-30 IMAGING — CT CT ABD-PELV W/O CM
2 of 4 series · 16 of 46 positions shown, 18 images · non-contrast
Comparison: CT of the abdomen and pelvis from 12/01/2013

CLINICAL DATA: Acute onset of upper abdominal pain, nausea and
vomiting. Leukocytosis. Initial encounter.

EXAM:
CT ABDOMEN AND PELVIS WITHOUT CONTRAST
TECHNIQUE: Multidetector CT imaging of the abdomen and pelvis was performed
following the standard protocol without IV contrast.

[Series 2: axial st · axial · 0.98mm/px · z∈[-455,-15]mm · 13 of 98 slices shown, 15 images]
[im 5/98  soft-tissue]
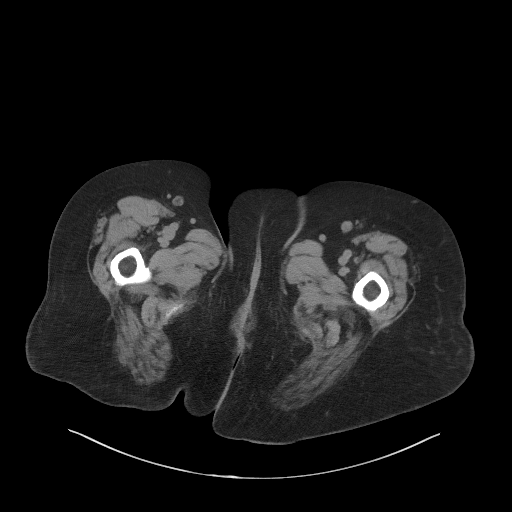
[im 5/98  bone]
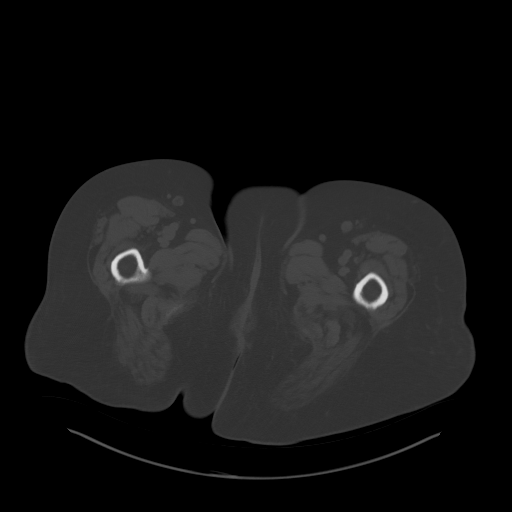
[im 13/98  soft-tissue]
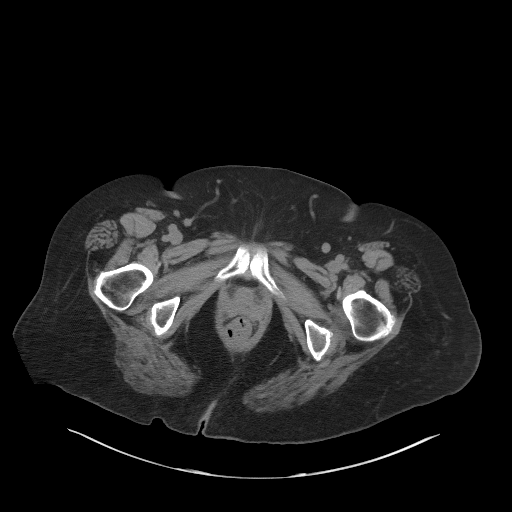
[im 21/98  soft-tissue]
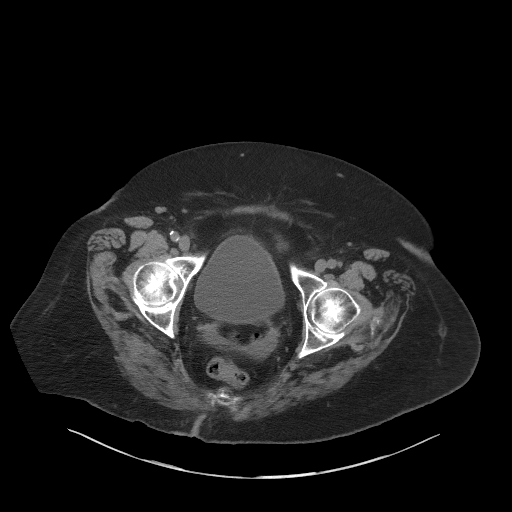
[im 29/98  soft-tissue]
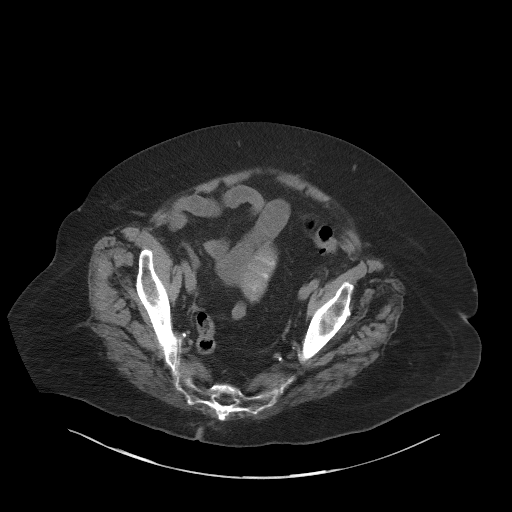
[im 33/98  soft-tissue]
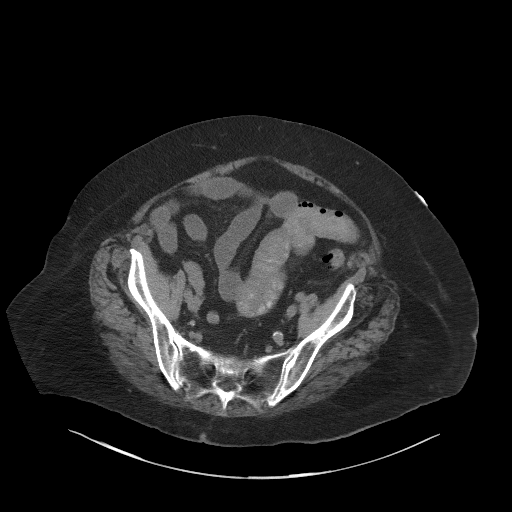
[im 41/98  soft-tissue]
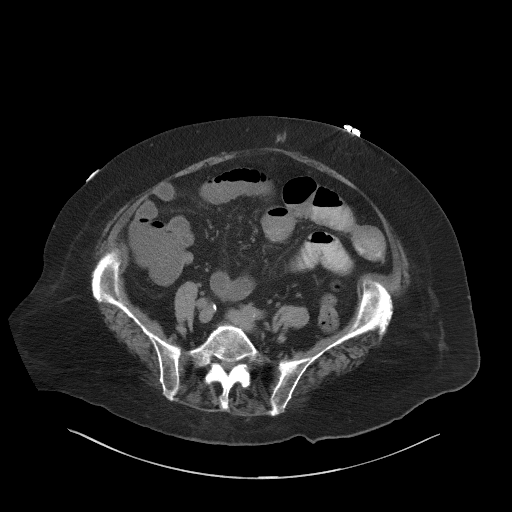
[im 49/98  soft-tissue]
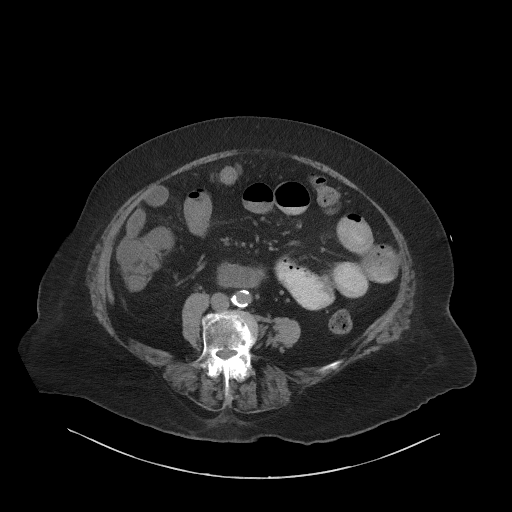
[im 57/98  soft-tissue]
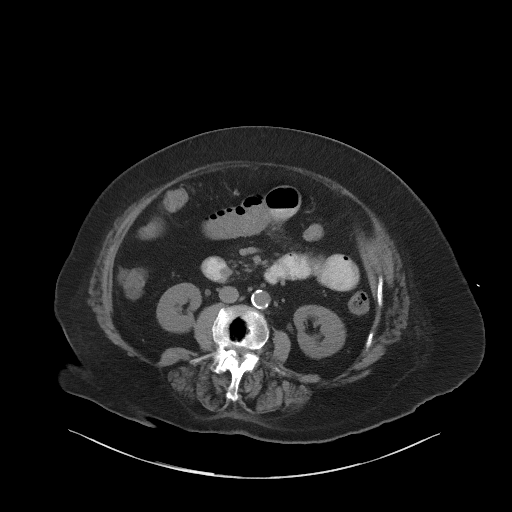
[im 65/98  soft-tissue]
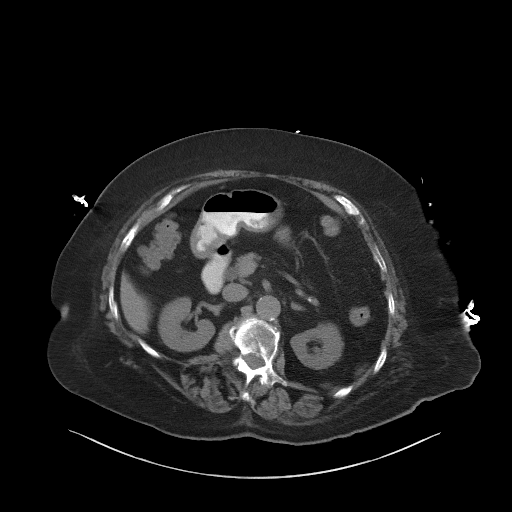
[im 65/98  bone]
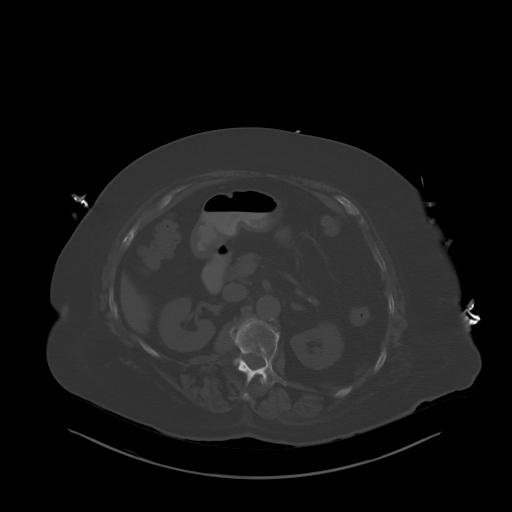
[im 69/98  soft-tissue]
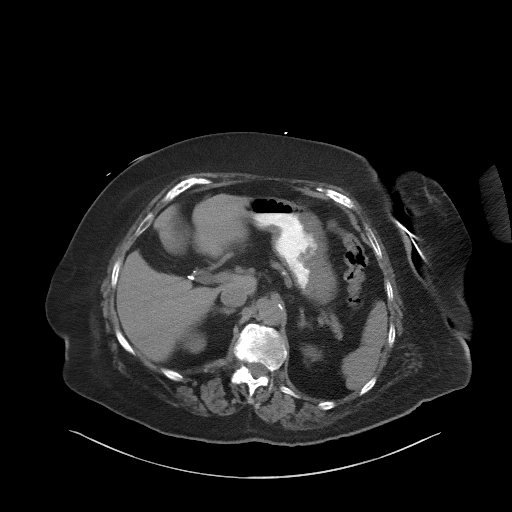
[im 77/98  soft-tissue]
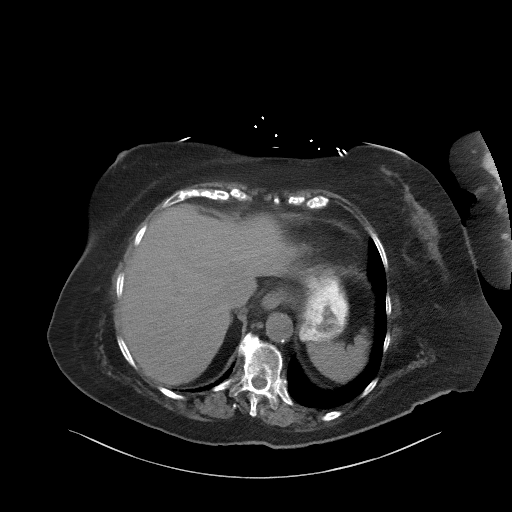
[im 85/98  soft-tissue]
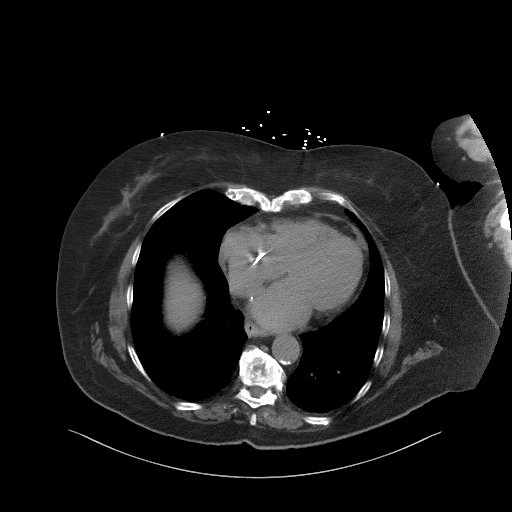
[im 93/98  soft-tissue]
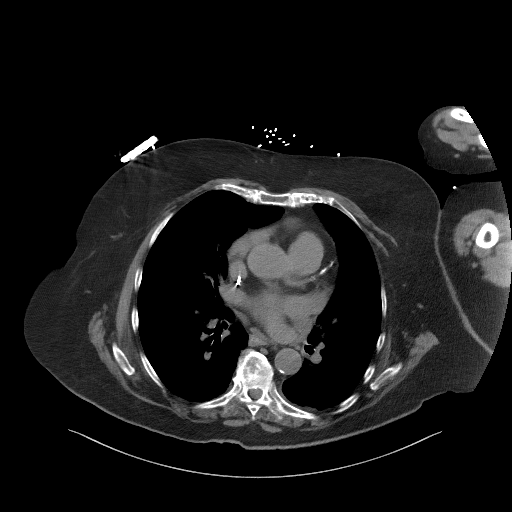

[Series 5: coronal st · coronal · 0.82mm/px · 3 of 89 slices shown]
[im 30/89  soft-tissue]
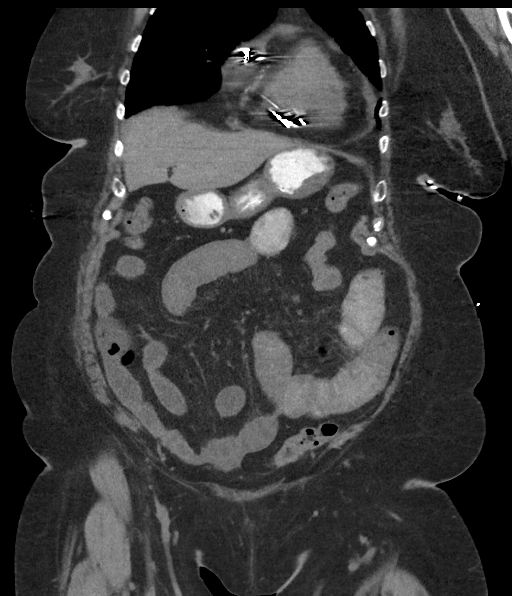
[im 40/89  soft-tissue]
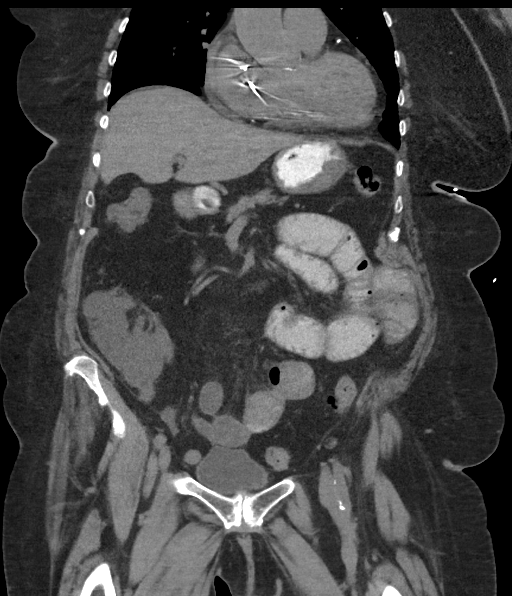
[im 49/89  soft-tissue]
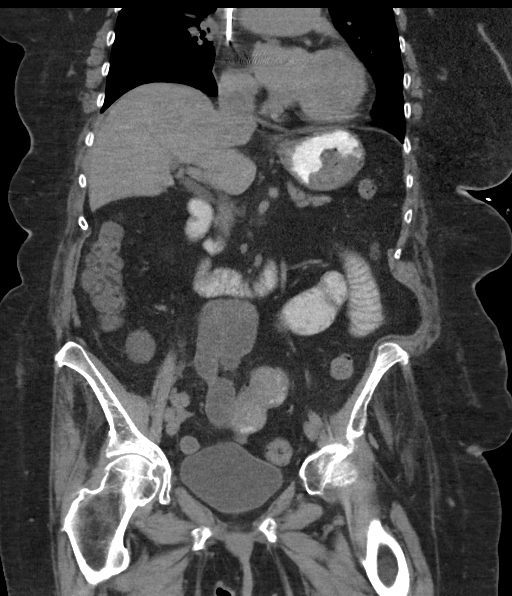

[16 of 46 positions shown; findings below may reference images not displayed]

FINDINGS: The visualized lung bases are clear. Trace pericardial fluid remains
within normal limits. Pacemaker leads are partially imaged.

The liver and spleen are unremarkable in appearance. The patient is
status post cholecystectomy, with clips noted at the gallbladder
fossa. The pancreas and adrenal glands are unremarkable.

The kidneys are unremarkable in appearance. There is no evidence of
hydronephrosis. No renal or ureteral stones are seen. No perinephric
stranding is appreciated.

No free fluid is identified. The small bowel is partially filled
with fluid and grossly unremarkable. The stomach is within normal
limits. No acute vascular abnormalities are seen.

The appendix is not definitely characterized; there is no evidence
of appendicitis. Mild diverticulosis is noted along the transverse,
descending and proximal sigmoid colon, without evidence of
diverticulitis.

The bladder is mildly distended and grossly unremarkable. The
patient is status post hysterectomy. No suspicious adnexal masses
are seen. No inguinal lymphadenopathy is seen.

No acute osseous abnormalities are identified. Multilevel vacuum
phenomenon is noted along the lumbar spine, with underlying facet
disease.
IMPRESSION: 1. No acute abnormality seen within the abdomen or pelvis.
2. Mild diverticulosis along the transverse, descending and proximal
sigmoid colon, without evidence of diverticulitis.
3. Mild degenerative change along the lumbar spine.

## 2021-04-23 ENCOUNTER — Emergency Department (HOSPITAL_BASED_OUTPATIENT_CLINIC_OR_DEPARTMENT_OTHER)
Admission: EM | Admit: 2021-04-23 | Discharge: 2021-04-23 | Disposition: A | Discharge status: Home / Self Care | Payer: Medicare Other | Attending: Emergency Medicine | Admitting: Emergency Medicine

## 2021-04-23 ENCOUNTER — Other Ambulatory Visit: Payer: Self-pay

## 2021-04-23 ENCOUNTER — Emergency Department (HOSPITAL_BASED_OUTPATIENT_CLINIC_OR_DEPARTMENT_OTHER): Payer: Medicare Other

## 2021-04-23 DIAGNOSIS — I2510 Atherosclerotic heart disease of native coronary artery without angina pectoris: Secondary | ICD-10-CM | POA: Diagnosis not present

## 2021-04-23 DIAGNOSIS — Z7982 Long term (current) use of aspirin: Secondary | ICD-10-CM | POA: Diagnosis not present

## 2021-04-23 DIAGNOSIS — I10 Essential (primary) hypertension: Secondary | ICD-10-CM | POA: Diagnosis not present

## 2021-04-23 DIAGNOSIS — L139 Bullous disorder, unspecified: Secondary | ICD-10-CM | POA: Insufficient documentation

## 2021-04-23 DIAGNOSIS — I878 Other specified disorders of veins: Secondary | ICD-10-CM | POA: Diagnosis not present

## 2021-04-23 DIAGNOSIS — Z7901 Long term (current) use of anticoagulants: Secondary | ICD-10-CM | POA: Insufficient documentation

## 2021-04-23 DIAGNOSIS — Z79899 Other long term (current) drug therapy: Secondary | ICD-10-CM | POA: Insufficient documentation

## 2021-04-23 DIAGNOSIS — I872 Venous insufficiency (chronic) (peripheral): Secondary | ICD-10-CM | POA: Insufficient documentation

## 2021-04-23 DIAGNOSIS — I4891 Unspecified atrial fibrillation: Secondary | ICD-10-CM | POA: Diagnosis not present

## 2021-04-23 DIAGNOSIS — R609 Edema, unspecified: Secondary | ICD-10-CM

## 2021-04-23 DIAGNOSIS — R601 Generalized edema: Secondary | ICD-10-CM | POA: Insufficient documentation

## 2021-04-23 DIAGNOSIS — R238 Other skin changes: Secondary | ICD-10-CM

## 2021-04-23 DIAGNOSIS — M7989 Other specified soft tissue disorders: Secondary | ICD-10-CM | POA: Diagnosis present

## 2021-04-23 DIAGNOSIS — Z950 Presence of cardiac pacemaker: Secondary | ICD-10-CM | POA: Insufficient documentation

## 2021-04-23 LAB — COMPREHENSIVE METABOLIC PANEL
ALT: 12 U/L (ref 0–44)
AST: 22 U/L (ref 15–41)
Albumin: 3.9 g/dL (ref 3.5–5.0)
Alkaline Phosphatase: 78 U/L (ref 38–126)
Anion gap: 10 (ref 5–15)
BUN: 13 mg/dL (ref 8–23)
CO2: 31 mmol/L (ref 22–32)
Calcium: 9 mg/dL (ref 8.9–10.3)
Chloride: 95 mmol/L — ABNORMAL LOW (ref 98–111)
Creatinine, Ser: 0.94 mg/dL (ref 0.44–1.00)
GFR, Estimated: 59 mL/min — ABNORMAL LOW (ref >60)
Glucose, Bld: 112 mg/dL — ABNORMAL HIGH (ref 70–99)
Potassium: 3.8 mmol/L (ref 3.5–5.1)
Sodium: 136 mmol/L (ref 135–145)
Total Bilirubin: 0.6 mg/dL (ref 0.3–1.2)
Total Protein: 8 g/dL (ref 6.5–8.1)

## 2021-04-23 LAB — CBC WITH DIFFERENTIAL/PLATELET
Abs Immature Granulocytes: 0.03 10*3/uL (ref 0.00–0.07)
Basophils Absolute: 0 10*3/uL (ref 0.0–0.1)
Basophils Relative: 0 %
Eosinophils Absolute: 0.1 10*3/uL (ref 0.0–0.5)
Eosinophils Relative: 1 %
HCT: 41.7 % (ref 36.0–46.0)
Hemoglobin: 13.7 g/dL (ref 12.0–15.0)
Immature Granulocytes: 0 %
Lymphocytes Relative: 22 %
Lymphs Abs: 1.9 10*3/uL (ref 0.7–4.0)
MCH: 30.4 pg (ref 26.0–34.0)
MCHC: 32.9 g/dL (ref 30.0–36.0)
MCV: 92.7 fL (ref 80.0–100.0)
Monocytes Absolute: 0.6 10*3/uL (ref 0.1–1.0)
Monocytes Relative: 7 %
Neutro Abs: 5.9 10*3/uL (ref 1.7–7.7)
Neutrophils Relative %: 70 %
Platelets: 220 10*3/uL (ref 150–400)
RBC: 4.5 MIL/uL (ref 3.87–5.11)
RDW: 14 % (ref 11.5–15.5)
WBC: 8.6 10*3/uL (ref 4.0–10.5)
nRBC: 0 % (ref 0.0–0.2)

## 2021-04-23 LAB — BRAIN NATRIURETIC PEPTIDE: B Natriuretic Peptide: 264.5 pg/mL — ABNORMAL HIGH (ref 0.0–100.0)

## 2021-04-23 MED ORDER — FUROSEMIDE 20 MG PO TABS: 20.0000 mg | ORAL_TABLET | Freq: Every day | ORAL | 0 refills | Status: AC

## 2021-04-23 MED ORDER — METOPROLOL TARTRATE 25 MG PO TABS
50.0000 mg | ORAL_TABLET | Freq: Once | ORAL | Status: DC
  Filled 2021-04-23: qty 2

## 2021-04-23 NOTE — ED Provider Notes (Signed)
[facility] EMERGENCY DEPARTMENT Provider Note   CSN: [id number] Arrival date & time: 04/23/21  1651     History No chief complaint on file.   [name] is a 87 y.o. female.  HPI     87 year old female with a history of atrial fibrillation on digoxin and Eliquis, hypertension, hyperlipidemia, CVA, pacemaker, coronary artery disease who presents with concern for leg swelling with blisters and wound.  Reports over the last week, her left lower extremity edema has been getting worse.  She has had previous DVT studies in the past that she has had chronic asymmetric swelling, but the swelling has been worse over the last week.  With increasing swelling, she is also noticed some seepage of fluid from her leg and development of blisters.  Reports today this was opened with more fluid seeping.  Denies any significant pain.  Reports she is vomiting due to chronic abdominal swelling..  Denies chest pain, shortness of breath, fevers.  She reports that she used to take Lasix on occasion as needed, however now has been taking it more frequently recently and that she does not feel it is helping.  Past Medical History:  Diagnosis Date   Atrial fibrillation (HCC) 04/20/2013   Cardiac pacemaker in situ 04/21/2013   05/27/10 SSS atrial fibrillation and pauses [place], [place] Dr. Collier Bullock  [employer] [id number] H  RA  lead [phone]   [id number] RV  lead 5076  [id number]    Coronary artery disease    Glaucoma    History of stroke 04/21/2013   2006     Hyperlipidemia    Hypertension    Hypertensive heart disease     Patient Active Problem List   Diagnosis Date Noted   Long-term (current) use of anticoagulants 04/25/2013   History of stroke 04/21/2013   Cardiac pacemaker in situ 04/21/2013   Atrial fibrillation (HCC) 04/20/2013   CAD (coronary artery disease) 04/20/2013   Hypertensive heart disease     Past Surgical History:  Procedure Laterality Date   ABDOMINAL HYSTERECTOMY      CAROTID STENT     CARPAL TUNNEL RELEASE     CHOLECYSTECTOMY     EYE SURGERY     PACEMAKER INSERTION       OB History   No obstetric history on file.     No family history on file.  Social History   Tobacco Use   Smoking status: Never  Substance Use Topics   Alcohol use: No   Drug use: No    Home Medications Prior to Admission medications   Medication Sig Start Date End Date Taking? Authorizing Provider  furosemide (LASIX) 20 MG tablet Take 1 tablet (20 mg total) by mouth daily for 3 days. 04/23/21 04/26/21 Yes Alvira Monday, MD  apixaban (ELIQUIS) 5 MG TABS tablet Take 1 tablet (5 mg total) by mouth 2 (two) times daily. 04/25/13   Othella Boyer, MD  aspirin EC 81 MG EC tablet Take 1 tablet (81 mg total) by mouth daily. 04/25/13   Othella Boyer, MD  digoxin (LANOXIN) 0.125 MG tablet Take 1 tablet (0.125 mg total) by mouth daily. 04/25/13   Othella Boyer, MD  hydrALAZINE (APRESOLINE) 25 MG tablet Take 25 mg by mouth daily as needed (for high blood pressure).     [provider]  latanoprost (XALATAN) 0.005 % ophthalmic solution Place 1 drop into both eyes at bedtime.    [provider]  levothyroxine (SYNTHROID, LEVOTHROID) 100 MCG tablet  Take 100 mcg by mouth daily.    [provider]  metoprolol (LOPRESSOR) 50 MG tablet Take 1 tablet (50 mg total) by mouth 2 (two) times daily. 04/25/13   Othella Boyer, MD  ondansetron (ZOFRAN) 4 MG tablet Take 1 tablet (4 mg total) by mouth every 6 (six) hours. 06/16/16   Lavera Guise, MD  pravastatin (PRAVACHOL) 40 MG tablet Take 80 mg by mouth daily.    [provider]    Allergies    Ciprofloxacin, Crestor [rosuvastatin], Hydrochlorothiazide, Lipitor [atorvastatin], Cardizem [diltiazem hcl], and Ivp dye [iodinated diagnostic agents]  Review of Systems   Review of Systems  Constitutional:  Negative for fever.  HENT:  Negative for sore throat.   Eyes:  Negative for visual disturbance.   Respiratory:  Negative for cough and shortness of breath.   Cardiovascular:  Positive for leg swelling. Negative for chest pain.  Gastrointestinal:  Negative for abdominal pain, nausea and vomiting.  Genitourinary:  Negative for difficulty urinating.  Musculoskeletal:  Negative for back pain and neck pain.  Skin:  Negative for rash.  Neurological:  Negative for syncope and headaches.   Physical Exam Updated Vital Signs BP 117/75   Pulse (!) 103   Temp 98.5 F (36.9 C) (Oral)   Resp 18   Ht 5\' 3"  (1.6 m)   Wt 92.1 kg   SpO2 94%   BMI 35.96 kg/m   Physical Exam Vitals and nursing note reviewed.  Constitutional:      General: She is not in acute distress.    Appearance: She is well-developed. She is not diaphoretic.  HENT:     Head: Normocephalic and atraumatic.  Eyes:     Conjunctiva/sclera: Conjunctivae normal.  Cardiovascular:     Rate and Rhythm: Normal rate and regular rhythm.     Heart sounds: Normal heart sounds. No murmur heard.   No friction rub. No gallop.  Pulmonary:     Effort: Pulmonary effort is normal. No respiratory distress.     Breath sounds: Normal breath sounds. No wheezing or rales.  Abdominal:     General: There is no distension.     Palpations: Abdomen is soft.     Tenderness: There is no abdominal tenderness. There is no guarding.  Musculoskeletal:        General: No tenderness.     Cervical back: Normal range of motion.     Right lower leg: Edema present.     Left lower leg: Edema (LLE gretaer than right) present.  Skin:    General: Skin is warm and dry.     Findings: Erythema (mild erythema, bullae, edema) present. No rash.  Neurological:     Mental Status: She is alert and oriented to person, place, and time.    ED Results / Procedures / Treatments   Labs (all labs ordered are listed, but only abnormal results are displayed) Labs Reviewed  COMPREHENSIVE METABOLIC PANEL - Abnormal; Notable for the following components:      Result  Value   Chloride 95 (*)    Glucose, Bld 112 (*)    GFR, Estimated 59 (*)    All other components within normal limits  BRAIN NATRIURETIC PEPTIDE - Abnormal; Notable for the following components:   B Natriuretic Peptide 264.5 (*)    All other components within normal limits  CBC WITH DIFFERENTIAL/PLATELET    EKG EKG Interpretation  Date/Time:  Saturday April 23 2021 18:00:39 EDT Ventricular Rate:  114 PR Interval:  QRS Duration: 89 QT Interval:  336 QTC Calculation: 463 R Axis:   66 Text Interpretation: Atrial fibrillation Ventricular premature complex Low voltage, precordial leads Borderline T abnormalities, inferior leads No significant change since Confirmed by Alvira Monday [phone]) on 04/23/2021 7:32:02 PM Also confirmed by Alvira Monday [phone]), editor Jillene Bucks [phone]  on 04/24/2021 12:23:38 PM  Radiology US Venous Img Lower Unilateral Left  Result Date: 04/23/2021 CLINICAL DATA:  Left ankle swelling for 6 months. Recent pain, redness, wheezing, and blisters. EXAM: Left LOWER EXTREMITY VENOUS DOPPLER ULTRASOUND TECHNIQUE: Gray-scale sonography with compression, as well as color and duplex ultrasound, were performed to evaluate the deep venous system(s) from the level of the common femoral vein through the popliteal and proximal calf veins. COMPARISON:  None. FINDINGS: VENOUS Normal compressibility of the common femoral, superficial femoral, and popliteal veins, as well as the visualized calf veins. Visualized portions of profunda femoral vein and great saphenous vein unremarkable. No filling defects to suggest DVT on grayscale or color Doppler imaging. Doppler waveforms show normal direction of venous flow, normal respiratory plasticity and response to augmentation. Limited views of the contralateral common femoral vein are unremarkable. OTHER Mild edema in the soft tissues of the left ankle. Limitations: none IMPRESSION: No acute deep venous thrombosis demonstrated in the  visualized lower extremity veins. Electronically Signed   By: Burman Nieves M.D.   On: 04/23/2021 19:27    Procedures Procedures   Medications Ordered in ED Medications - No data to display  ED Course  I have reviewed the triage vital signs and the nursing notes.  Pertinent labs & imaging results that were available during my care of the patient were reviewed by me and considered in my medical decision making (see chart for details).    MDM Rules/Calculators/A&P                           86 year old female with a history of atrial fibrillation on digoxin and Eliquis, hypertension, hyperlipidemia, CVA, pacemaker, coronary artery disease who presents with concern for leg swelling with blisters and wound.   Given worsening edema in LLE, missed doses of eliquis, ordered DVT US which showed no DVT. No significant electrolyte abnormalities, mild elevation BNP without previous. No dyspnea.  Findings on exam most consistent with stasis dermatitis and  edema bullae and do not suspect cellulitis at this time. Discussed if worsening redness, pain or fever she need reeavlauted. Instructed to take lasix every day, elevate legs, local wound care with antibiotic ointment, PCP follow up.    Final Clinical Impression(s) / ED Diagnoses Final diagnoses:  Peripheral edema  Venous stasis dermatitis of left lower extremity  Bullae    Rx / DC Orders ED Discharge Orders          Ordered    furosemide (LASIX) 20 MG tablet  Daily        04/23/21 1938             Alvira Monday, MD 04/25/21 1147

## 2021-04-23 NOTE — ED Notes (Signed)
Placed nonstick guaze and kling to pts left lower leg to help with the drainage. Pt states that it feels better wrapped. CMS intact after placement. Pt able to stand and pivot from stretcher to wheelchair with assistance and wheeled to car for discharge home.

## 2021-04-23 NOTE — ED Triage Notes (Signed)
Pt arrives pov with family, endorses LLE swelling and weeping x 5 days. Redness and swelling noted. Pt reports rupturing distal blister. Bandage applied, denies injury. Pt denies CP, denies fever. Pt has pacemaker

## 2021-04-23 NOTE — ED Notes (Signed)
Pt stated that she did not want the requested BP med do to her pressure being back normal and that she only took it when it was elevated and was afraid that it would drop her pressure too much it she took it. Pt denies any headache at this time.

## 2021-04-23 NOTE — Discharge Instructions (Addendum)
Increase lasix to 60mg  daily for 3 days (you are currently on 40mg  and I have added a prescription for 20mg  that you may add to this dose per day)

## 2021-09-06 DEATH — deceased

## 2022-09-06 IMAGING — US US EXTREM LOW VENOUS*L*
1 series · 14 of 24 positions shown · non-contrast
Comparison: None.

CLINICAL DATA: Left ankle swelling for 6 months. Recent pain,
redness, wheezing, and blisters.

EXAM:
Left LOWER EXTREMITY VENOUS DOPPLER ULTRASOUND
TECHNIQUE: Gray-scale sonography with compression, as well as color and duplex
ultrasound, were performed to evaluate the deep venous system(s)
from the level of the common femoral vein through the popliteal and
proximal calf veins.

[Series 1: us extrem low venous*left* · 14 of 39 slices shown]
[im 1/39]
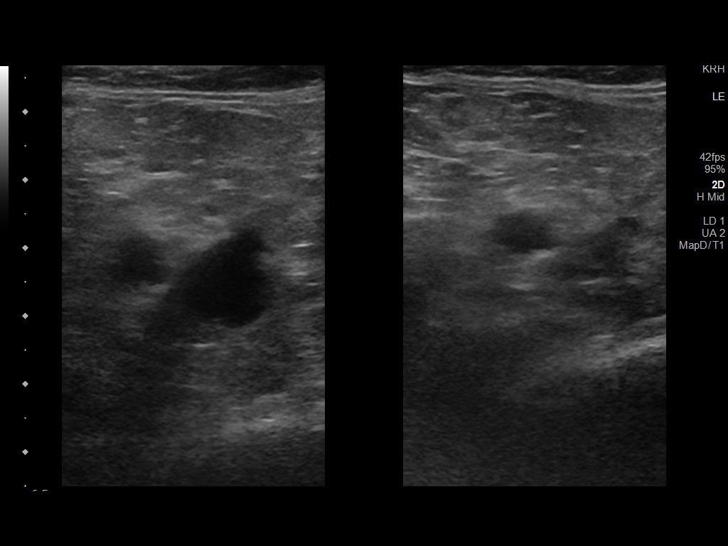
[im 4/39]
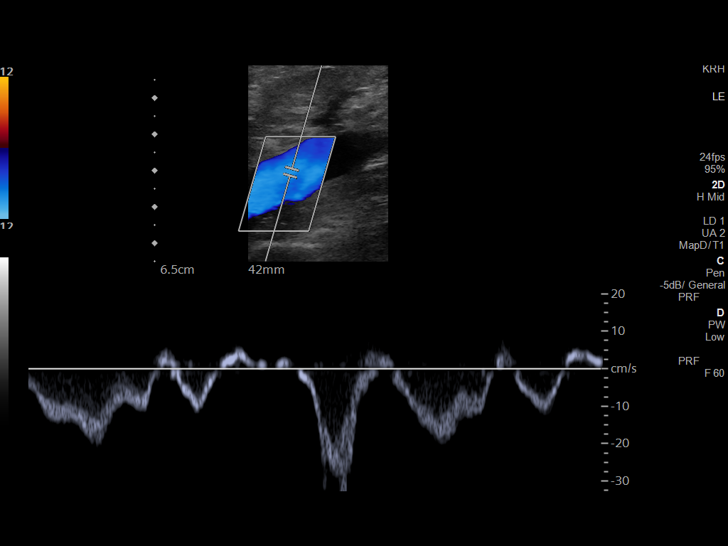
[im 7/39]
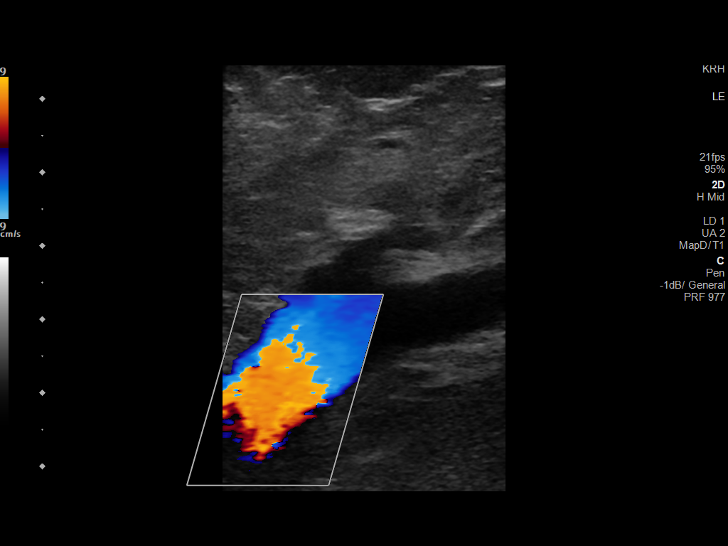
[im 10/39]
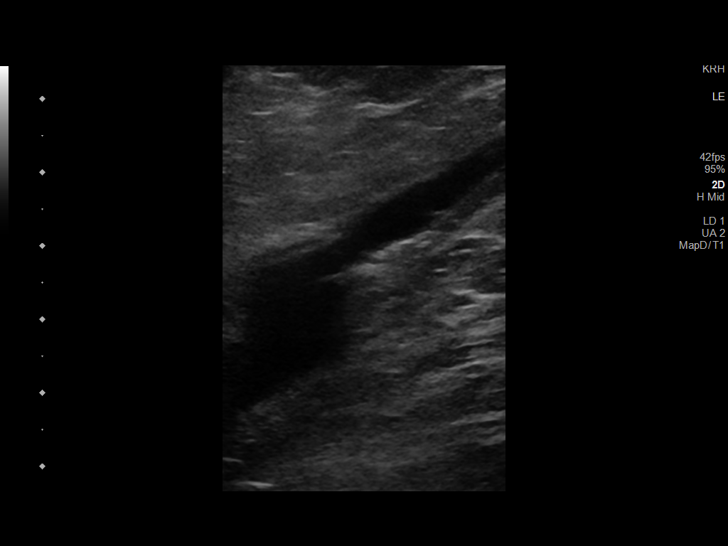
[im 12/39]
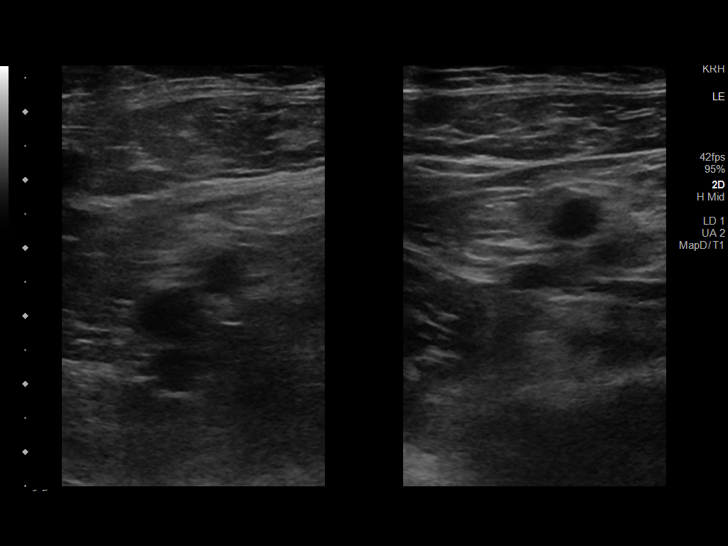
[im 15/39]
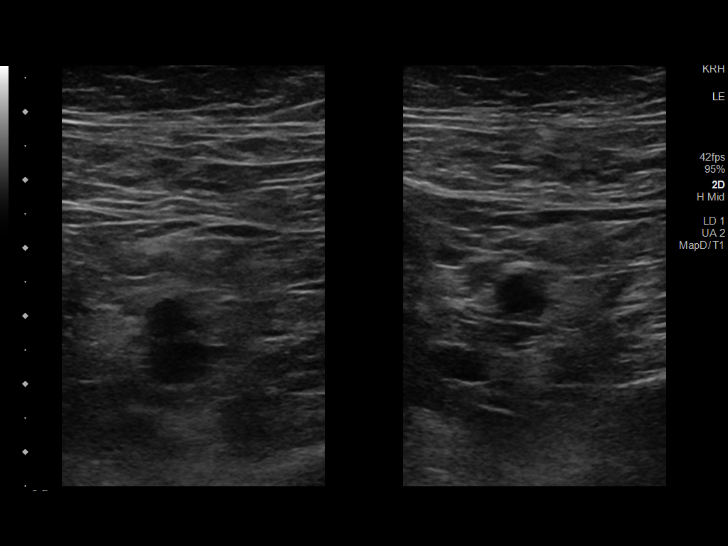
[im 19/39]
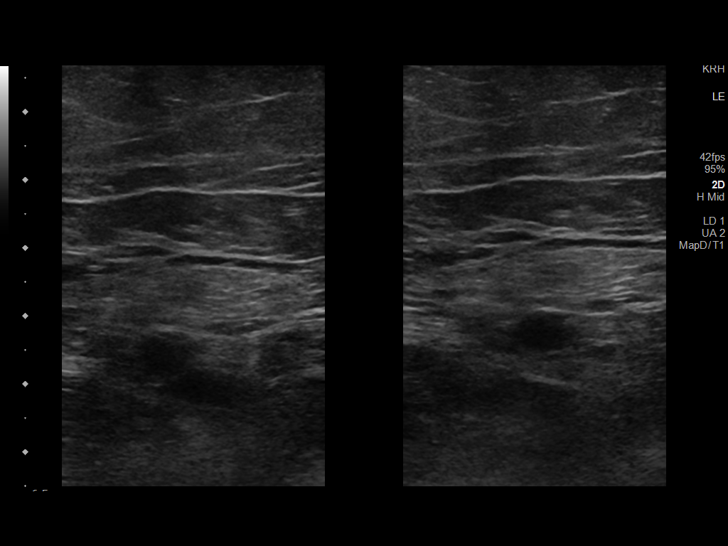
[im 20/39]
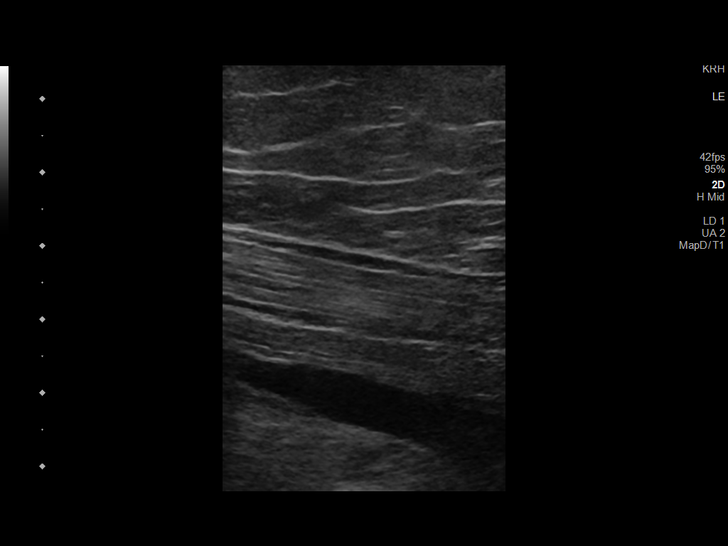
[im 24/39]
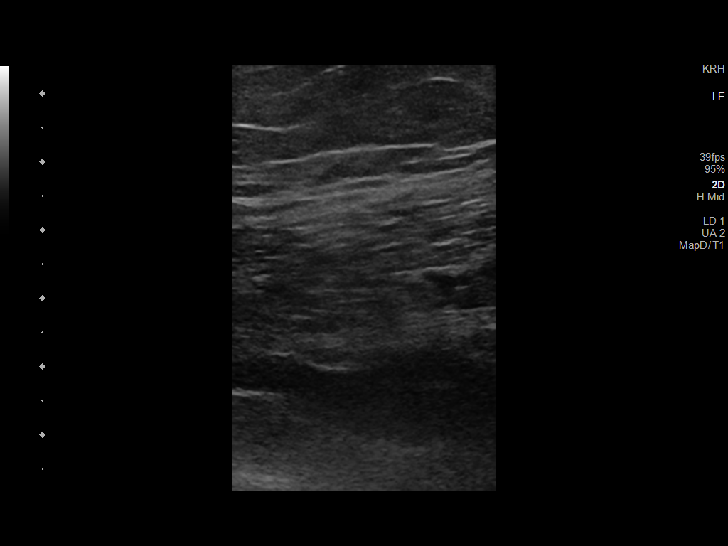
[im 27/39]
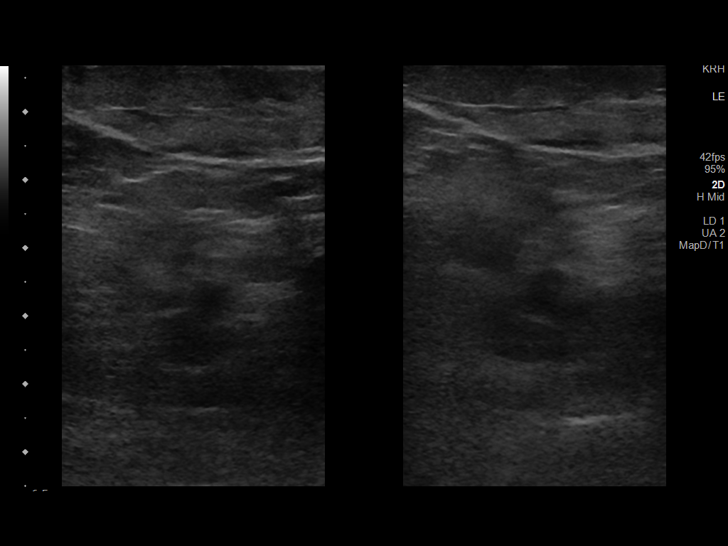
[im 30/39]
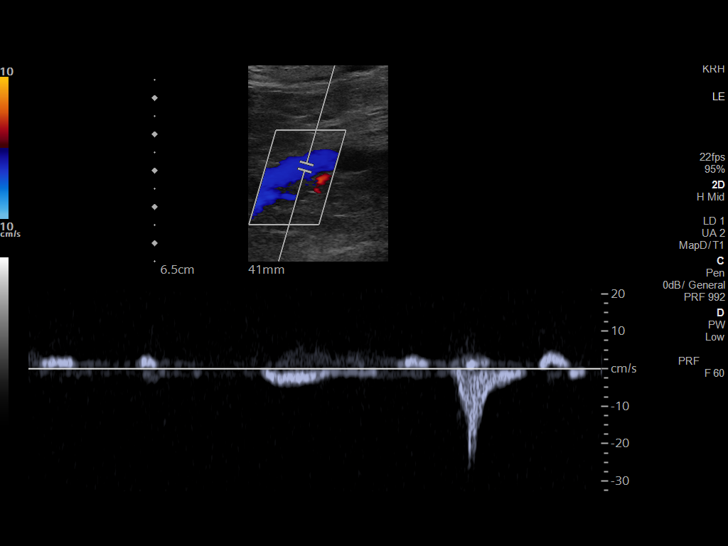
[im 32/39]
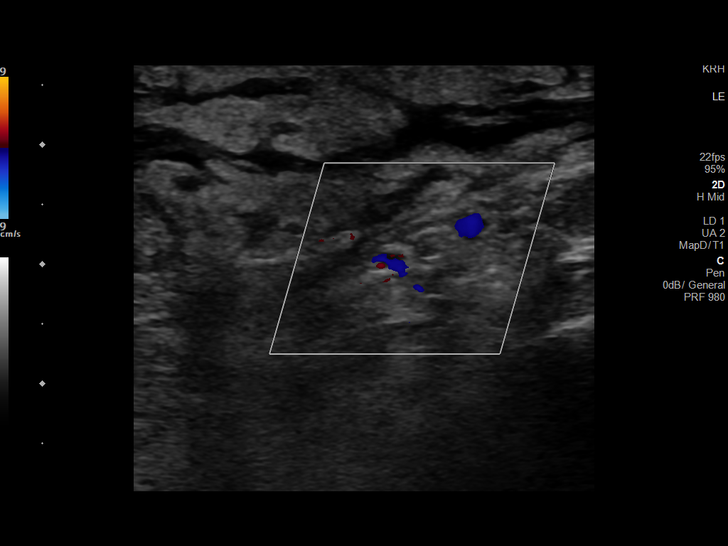
[im 35/39]
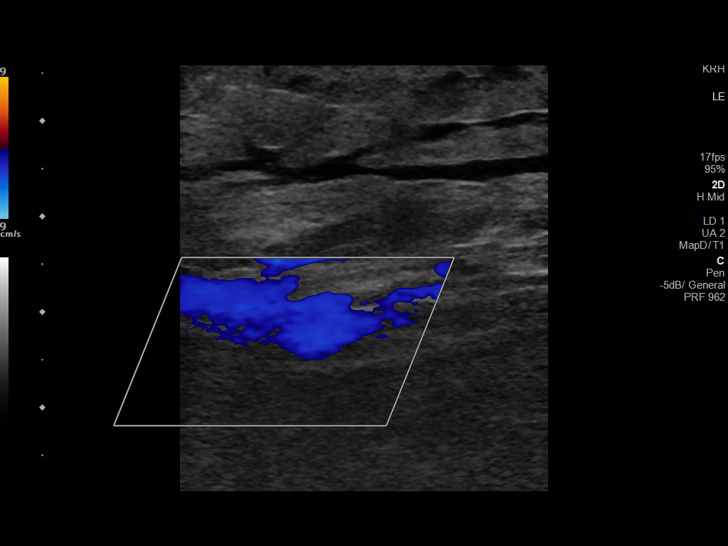
[im 39/39]
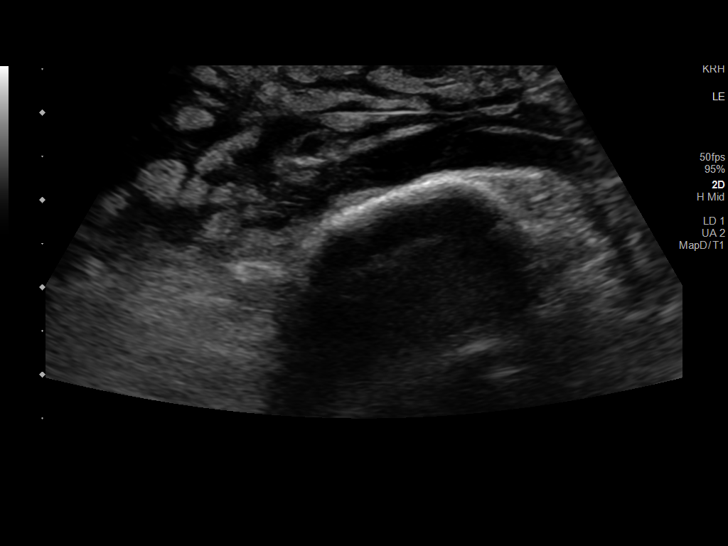

[14 of 24 positions shown; findings below may reference images not displayed]

FINDINGS: VENOUS

Normal compressibility of the common femoral, superficial femoral,
and popliteal veins, as well as the visualized calf veins.
Visualized portions of profunda femoral vein and great saphenous
vein unremarkable. No filling defects to suggest DVT on grayscale or
color Doppler imaging. Doppler waveforms show normal direction of
venous flow, normal respiratory plasticity and response to
augmentation.

Limited views of the contralateral common femoral vein are
unremarkable.

OTHER

Mild edema in the soft tissues of the left ankle.

Limitations: none
IMPRESSION: No acute deep venous thrombosis demonstrated in the visualized lower
extremity veins.
